# Patient Record
Sex: Male | Born: 1985 | Race: Black or African American | Hispanic: No | Marital: Single | Smoking: Current every day smoker
Health system: Southern US, Community
[De-identification: ages and names within clinical notes are randomized; demographics above are authoritative.]

## PROBLEM LIST (undated history)

## (undated) HISTORY — PX: NO PAST SURGERIES: SHX2092

---
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
## 2011-03-07 ENCOUNTER — Emergency Department: Payer: Self-pay | Admitting: Emergency Medicine

## 2011-03-12 ENCOUNTER — Emergency Department: Payer: Self-pay | Admitting: *Deleted

## 2011-03-14 ENCOUNTER — Emergency Department: Payer: Self-pay | Admitting: Emergency Medicine
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
## 2014-03-23 ENCOUNTER — Ambulatory Visit: Payer: Self-pay | Admitting: Family Medicine

## 2014-03-23 LAB — URINALYSIS, COMPLETE
Bacteria: NEGATIVE
Bilirubin,UR: NEGATIVE
Blood: NEGATIVE
Glucose,UR: NEGATIVE
Ketone: NEGATIVE
LEUKOCYTE ESTERASE: NEGATIVE
NITRITE: NEGATIVE
Ph: 5.5 (ref 5.0–8.0)
Protein: NEGATIVE
Specific Gravity: 1.015 (ref 1.000–1.030)
Squamous Epithelial: NONE SEEN

## 2015-09-11 ENCOUNTER — Ambulatory Visit
Admission: EM | Admit: 2015-09-11 | Discharge: 2015-09-11 | Disposition: A | Discharge status: Home / Self Care | Payer: Worker's Compensation | Attending: Family Medicine | Admitting: Family Medicine

## 2015-09-11 ENCOUNTER — Ambulatory Visit (INDEPENDENT_AMBULATORY_CARE_PROVIDER_SITE_OTHER): Payer: Worker's Compensation

## 2015-09-11 DIAGNOSIS — S40012A Contusion of left shoulder, initial encounter: Secondary | ICD-10-CM

## 2015-09-11 DIAGNOSIS — S20212A Contusion of left front wall of thorax, initial encounter: Secondary | ICD-10-CM | POA: Diagnosis not present

## 2015-09-11 MED ORDER — NAPROXEN 500 MG PO TABS: 500.0000 mg | ORAL_TABLET | Freq: Two times a day (BID) | ORAL | Status: DC

## 2015-09-11 MED ORDER — KETOROLAC TROMETHAMINE 60 MG/2ML IM SOLN
60.0000 mg | Freq: Once | INTRAMUSCULAR | Status: AC
  Administered 2015-09-11: 60 mg via INTRAMUSCULAR

## 2015-09-11 MED ORDER — TIZANIDINE HCL 4 MG PO TABS: 4.0000 mg | ORAL_TABLET | Freq: Four times a day (QID) | ORAL | Status: DC | PRN

## 2015-09-11 NOTE — ED Notes (Signed)
Patient states that he was at work and fell off a truck and fell on to the ground on his left shoulder. 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 Patient states that chest wall feels numb. Patient states that this happened today while at work this morning.

## 2015-09-11 NOTE — ED Provider Notes (Signed)
CSN: [id number]     Arrival date & time 09/11/15  1125 History   First MD Initiated Contact with Patient 09/11/15 1254     Chief Complaint  Patient presents with  . 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 He denies any loss of consciousness or feeling dazed did not strike his head, his main complaint is of shoulder pain which she feels mostly [facility] and over his anterior chest. He states that he has discomfort in his chest when he takes a deep breath cough or sneeze. He has no hemoptysis or shortness of breath and is having 100% O2 sat on room air. He states he is also having some numbness and tingling over his anterior chest. It is not radiating down his arm and he does not have any neck stiffness except with extreme lateral flexion to the opposite side.  History reviewed. No pertinent past medical history. Past Surgical History  Procedure Laterality Date  . No past surgeries     History reviewed. No pertinent family history. Social History  Substance Use Topics  . Smoking status: Current Every Day Smoker -- 0.50 packs/day    Types: Cigarettes  . Smokeless tobacco: None  . Alcohol Use: No    Review of Systems  Constitutional: Positive for activity change. Negative for fever, chills and fatigue.  Respiratory: Negative for cough, shortness of breath, wheezing and stridor.   Cardiovascular: Positive for chest pain.  Musculoskeletal: Positive for myalgias.  All other systems reviewed and are negative.   Allergies  Review of patient's allergies indicates no known allergies.  Home Medications   Prior to Admission  medications   Medication Sig Start Date End Date Taking? Authorizing Provider  naproxen (NAPROSYN) 500 MG tablet Take 1 tablet (500 mg total) by mouth 2 (two) times daily with a meal. 09/11/15   Lutricia FeilWilliam P Eris Hannan, PA-C  tiZANidine (ZANAFLEX) 4 MG tablet Take 1 tablet (4 mg total) by mouth every 6 (six) hours as needed for muscle spasms. 09/11/15   Lutricia FeilWilliam P Matha Masse, PA-C   Meds Ordered and Administered this Visit   Medications  ketorolac (TORADOL) injection 60 mg (60 mg Intramuscular Given 09/11/15 1329)    BP 135/76 mmHg  Pulse 113  Temp(Src) 98 F (36.7 C) (Tympanic)  Resp 16  Ht 6\' 1"  (1.854 m)  Wt 245 lb (111.131 kg)  BMI 32.33 kg/m2  SpO2 100% No data found.   Physical Exam  Constitutional: He is oriented to person, place, and time. He appears well-developed and well-nourished. No distress.  HENT:  Head: Normocephalic and atraumatic.  Right Ear: External ear normal.  Left Ear: External ear normal.  Nose: Nose normal.  Mouth/Throat: Oropharynx is clear and moist.  Eyes: Conjunctivae and EOM are normal. Pupils are equal, round, and reactive to light. Right eye exhibits no discharge. Left eye exhibits no discharge.  Neck: Normal range of motion. Neck supple.  Pulmonary/Chest: Effort normal and breath sounds normal. No stridor. No respiratory distress. He has no wheezes. He has no rales.  Musculoskeletal: Normal range of motion. He exhibits edema and tenderness.  Lymphadenopathy:    He  has no cervical adenopathy.  Neurological: He is alert and oriented to person, place, and time.  Skin: Skin is warm and dry. He is not diaphoretic.  Psychiatric: He has a normal mood and affect. His behavior is normal. Judgment and thought content normal.  Nursing note and vitals reviewed.   ED Course  Procedures (including critical care time)  Labs Review Labs Reviewed - No data to display  Imaging Review Dg Chest 2 View  09/11/2015  CLINICAL DATA:  Fall 4- 5 feet off truck today. Back  and right shoulder pain. EXAM: CHEST  2 VIEW COMPARISON:  None. FINDINGS: Lungs are well inflated without effusion, consolidation or pneumothorax. Cardiomediastinal silhouette is within normal. Bones and soft tissues are within normal. IMPRESSION: No acute findings. Electronically Signed   By: Elberta Fortis M.D.   On: 09/11/2015 14:01   Dg Shoulder Left  09/11/2015  CLINICAL DATA:  Acute left shoulder pain after falling off truck today. Initial encounter. EXAM: LEFT SHOULDER - 2+ VIEW COMPARISON:  None. FINDINGS: There is no evidence of fracture or dislocation. There is no evidence of arthropathy or other focal bone abnormality. Soft tissues are unremarkable. IMPRESSION: Normal left shoulder. Electronically Signed   By: Lupita Raider, M.D.   On: 09/11/2015 14:02     Visual Acuity Review  Right Eye Distance:   Left Eye Distance:   Bilateral Distance:    Right Eye Near:   Left Eye Near:    Bilateral Near:     Medications  ketorolac (TORADOL) injection 60 mg (60 mg Intramuscular Given 09/11/15 1329)     MDM   1. Contusion of left chest wall, initial encounter   2. Contusion of left shoulder, initial encounter    New Prescriptions   NAPROXEN (NAPROSYN) 500 MG TABLET    Take 1 tablet (500 mg total) by mouth 2 (two) times daily with a meal.   TIZANIDINE (ZANAFLEX) 4 MG TABLET    Take 1 tablet (4 mg total) by mouth every 6 (six) hours as needed for muscle spasms.  Plan: 1. Test/x-ray results and diagnosis reviewed with patient 2. rx as per orders; risks, benefits, potential side effects reviewed with patient 3. Recommend supportive treatment with Frequent cough and deep breathing. Use ice initially then may start heat after 48 hours. I have shown him and he has demonstrated pendulum exercises for his shoulder to prevent frozen shoulder. Follow-up is with the Kela Millin FNP at [facility] if he has any problems or issues. He require orthopedic consult or physical therapy. Given him a note  for out of work for 2 days and restrictions for 1 week. 4. F/u prn if symptoms worsen or don't improve     Lutricia Feil, PA-C 09/11/15 1439

## 2015-09-11 NOTE — Discharge Instructions (Signed)
Chest Contusion A chest contusion is a deep bruise on your chest area. Contusions are the result of an injury that caused bleeding under the skin. A chest contusion may involve bruising of the skin, muscles, or ribs. The contusion may turn blue, purple, or yellow. Minor injuries will give you a painless contusion, but more severe contusions may stay painful and swollen for a few weeks. CAUSES  A contusion is usually caused by a blow, trauma, or direct force to an area of the body. SYMPTOMS   Swelling and redness of the injured area.  Discoloration of the injured area.  Tenderness and soreness of the injured area.  Pain. DIAGNOSIS  The diagnosis can be made by taking a history and performing a physical exam. An X-ray, CT scan, or MRI may be needed to determine if there were any associated injuries, such as broken bones (fractures) or internal injuries. TREATMENT  Often, the best treatment for a chest contusion is resting, icing, and applying cold compresses to the injured area. Deep breathing exercises may be recommended to reduce the risk of pneumonia. Over-the-counter medicines may also be recommended for pain control. HOME CARE INSTRUCTIONS   Put ice on the injured area.  Put ice in a plastic bag.  Place a towel between your skin and the bag.  Leave the ice on for 15-20 minutes, 03-04 times a day.  Only take over-the-counter or prescription medicines as directed by your caregiver. Your caregiver may recommend avoiding anti-inflammatory medicines (aspirin, ibuprofen, and naproxen) for 48 hours because these medicines may increase bruising.  Rest the injured area.  Perform deep-breathing exercises as directed by your caregiver.  Stop smoking if you smoke.  Do not lift objects over 5 pounds (2.3 kg) for 3 days or longer if recommended by your caregiver. SEEK IMMEDIATE MEDICAL CARE IF:   You have increased bruising or swelling.  You have pain that is getting worse.  You have  difficulty breathing.  You have dizziness, weakness, or fainting.  You have blood in your urine or stool.  You cough up or vomit blood.  Your swelling or pain is not relieved with medicines. MAKE SURE YOU:   Understand these instructions.  Will watch your condition.  Will get help right away if you are not doing well or get worse.   This information is not intended to replace advice given to you by your health care provider. Make sure you discuss any questions you have with your health care provider.   Document Released: 11/05/2000 Document Revised: 11/05/2011 Document Reviewed: 08/04/2011 Elsevier Interactive Patient Education 2016 [employer]  

## 2015-10-03 ENCOUNTER — Ambulatory Visit
Admission: EM | Admit: 2015-10-03 | Discharge: 2015-10-03 | Disposition: A | Discharge status: Home / Self Care | Payer: Worker's Compensation | Attending: Family Medicine | Admitting: Family Medicine

## 2015-10-03 DIAGNOSIS — M25512 Pain in left shoulder: Secondary | ICD-10-CM | POA: Diagnosis not present

## 2015-10-03 NOTE — ED Triage Notes (Signed)
Patient was seen here a few weeks ago for a workers comp injury, he is back today because he is complaining of left neck stiffness and left shoulder pain.

## 2015-10-03 NOTE — ED Provider Notes (Signed)
[facility]    CSN: [id number] Arrival date & time: 10/03/15  1127  First Provider Contact:  None       History   Chief Complaint Chief Complaint  Patient presents with  . Neck Injury    Workers Comp Injury    HPI Douglas Munoz is a 30 y.o. male.   HPI:  Patient presents today regarding his Workmen's Comp. Injury. He states that his symptoms are about the same Related to his left shoulder/ neck/arm. He has had some difficulty getting an appointment with Hassell [facility]imom[facility].  He states that he needed some type of claim number however never received the claim number. He is not gotten much information from his employer either.  History reviewed. No pertinent past medical history.  There are no active problems to display for this patient.   Past Surgical History:  Procedure Laterality Date  . NO PAST SURGERIES         Home Medications    Prior to Admission medications   Medication Sig Start Date End Date Taking? Authorizing Provider  naproxen (NAPROSYN) 500 MG tablet Take 1 tablet (500 mg total) by mouth 2 (two) times daily with a meal. 09/11/15  Yes Lutricia FeilWilliam P Roemer, PA-C  tiZANidine (ZANAFLEX) 4 MG tablet Take 1 tablet (4 mg total) by mouth every 6 (six) hours as needed for muscle spasms. 09/11/15  Yes Lutricia FeilWilliam P Roemer, PA-C    Family History History reviewed. No pertinent family history.  Social History Social History  Substance Use Topics  . Smoking status: Current Every Day Smoker    Packs/day: 0.50    Types: Cigarettes  . Smokeless tobacco: Never Used  . Alcohol use No     Allergies   Review of patient's allergies indicates no known allergies.   Review of Systems Review of Systems:  Negative except mentioned above.   Physical Exam Triage Vital Signs ED Triage Vitals  Enc Vitals Group     BP 10/03/15 1150 128/76     Pulse Rate 10/03/15 1150 73     Resp 10/03/15 1150 18     Temp 10/03/15 1150 98 F (36.7 C)     Temp Source 10/03/15  1150 Oral     SpO2 10/03/15 1150 100 %     Weight 10/03/15 1150 230 lb (104.3 kg)     Height 10/03/15 1150 6\' 1"  (1.854 m)     Head Circumference --      Peak Flow --      Pain Score 10/03/15 1151 7     Pain Loc --      Pain Edu? --      Excl. in GC? --    No data found.   Updated Vital Signs BP 128/76 (BP Location: Right Arm)   Pulse 73   Temp 98 F (36.7 C) (Oral)   Resp 18   Ht 6\' 1"  (1.854 m)   Wt 230 lb (104.3 kg)   SpO2 100%   BMI 30.34 kg/m      Physical Exam:  deferred   UC Treatments / Results  Labs (all labs ordered are listed, but only abnormal results are displayed) Labs Reviewed - No data to display  EKG  EKG Interpretation None       Radiology No results found.  Procedures Procedures (including critical care time)  Medications Ordered in UC Medications - No data to display   Initial Impression / Assessment and Plan / UC Course  I have  reviewed the triage vital signs and the nursing notes.  Pertinent labs & imaging results that were available during my care of the patient were reviewed by me and considered in my medical decision making (see chart for details).  Clinical Course   A/P:  Workmen's Comp. Injury-  Nurse called occupational health and they stated that a number is not required to get an appointment, the person he needs to speak with is at lunch so the patient will call back later today.  Patient may require further imaging and referral. Work restrictions given for one week for now.   Final Clinical Impressions(s) / UC Diagnoses   Final diagnoses:  None    New Prescriptions New Prescriptions   No medications on file     Jolene Provost, MD 10/03/15 1223

## 2016-06-18 ENCOUNTER — Telehealth (INDEPENDENT_AMBULATORY_CARE_PROVIDER_SITE_OTHER): Payer: Self-pay | Admitting: Orthopedic Surgery

## 2016-06-18 ENCOUNTER — Ambulatory Visit (INDEPENDENT_AMBULATORY_CARE_PROVIDER_SITE_OTHER): Payer: Self-pay | Admitting: Orthopedic Surgery

## 2016-06-18 NOTE — Telephone Encounter (Signed)
r/s appt with case mgr Carolee Rota while she was in the office today

## 2016-06-18 NOTE — Telephone Encounter (Signed)
Can you please call pt to resch his IME whenever you get the chance.  Thanks!

## 2016-07-16 ENCOUNTER — Ambulatory Visit (INDEPENDENT_AMBULATORY_CARE_PROVIDER_SITE_OTHER): Payer: Worker's Compensation | Admitting: Orthopedic Surgery

## 2016-07-16 ENCOUNTER — Encounter (INDEPENDENT_AMBULATORY_CARE_PROVIDER_SITE_OTHER): Payer: Self-pay | Admitting: Orthopedic Surgery

## 2016-07-16 DIAGNOSIS — M25512 Pain in left shoulder: Secondary | ICD-10-CM

## 2016-07-16 DIAGNOSIS — G8929 Other chronic pain: Secondary | ICD-10-CM
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
## 2016-08-14 ENCOUNTER — Ambulatory Visit (INDEPENDENT_AMBULATORY_CARE_PROVIDER_SITE_OTHER): Payer: Self-pay | Admitting: Orthopedic Surgery

## 2016-08-18 ENCOUNTER — Telehealth (INDEPENDENT_AMBULATORY_CARE_PROVIDER_SITE_OTHER): Payer: Self-pay

## 2016-08-18 NOTE — Telephone Encounter (Signed)
Faxed the 07/16/16 IME report to case mgr per her request

## 2018-05-02 ENCOUNTER — Emergency Department
Admission: EM | Admit: 2018-05-02 | Discharge: 2018-05-02 | Disposition: A | Discharge status: Other Acute Inpatient Hospital | Payer: Self-pay | Attending: Emergency Medicine | Admitting: Emergency Medicine

## 2018-05-02 ENCOUNTER — Other Ambulatory Visit: Payer: Self-pay

## 2018-05-02 DIAGNOSIS — Y9389 Activity, other specified: Secondary | ICD-10-CM | POA: Insufficient documentation

## 2018-05-02 DIAGNOSIS — F17210 Nicotine dependence, cigarettes, uncomplicated: Secondary | ICD-10-CM | POA: Insufficient documentation

## 2018-05-02 DIAGNOSIS — Y929 Unspecified place or not applicable: Secondary | ICD-10-CM | POA: Insufficient documentation

## 2018-05-02 DIAGNOSIS — Y998 Other external cause status: Secondary | ICD-10-CM | POA: Insufficient documentation

## 2018-05-02 DIAGNOSIS — T300 Burn of unspecified body region, unspecified degree: Secondary | ICD-10-CM

## 2018-05-02 DIAGNOSIS — T23311A Burn of third degree of right thumb (nail), initial encounter: Secondary | ICD-10-CM | POA: Insufficient documentation

## 2018-05-02 DIAGNOSIS — X102XXA Contact with fats and cooking oils, initial encounter: Secondary | ICD-10-CM | POA: Insufficient documentation

## 2018-05-02 DIAGNOSIS — Z79899 Other long term (current) drug therapy: Secondary | ICD-10-CM | POA: Insufficient documentation

## 2018-05-02 DIAGNOSIS — T23231A Burn of second degree of multiple right fingers (nail), not including thumb, initial encounter: Secondary | ICD-10-CM | POA: Insufficient documentation

## 2018-05-02 MED ORDER — HYDROMORPHONE HCL 1 MG/ML IJ SOLN
1.0000 mg | Freq: Once | INTRAMUSCULAR | Status: AC
  Administered 2018-05-02: 1 mg via INTRAVENOUS
  Filled 2018-05-02: qty 1

## 2018-05-02 MED ORDER — OXYCODONE-ACETAMINOPHEN 5-325 MG PO TABS
2.0000 | ORAL_TABLET | Freq: Once | ORAL | Status: AC
  Administered 2018-05-02: 2 via ORAL
  Filled 2018-05-02: qty 2

## 2018-05-02 MED ORDER — KETOROLAC TROMETHAMINE 30 MG/ML IJ SOLN
30.0000 mg | Freq: Once | INTRAMUSCULAR | Status: AC
  Administered 2018-05-02: 30 mg via INTRAVENOUS
  Filled 2018-05-02: qty 1

## 2018-05-02 MED ORDER — ONDANSETRON HCL 4 MG/2ML IJ SOLN
4.0000 mg | Freq: Once | INTRAMUSCULAR | Status: AC
  Administered 2018-05-02: 4 mg via INTRAVENOUS
  Filled 2018-05-02: qty 2

## 2018-05-02 MED ORDER — SODIUM CHLORIDE 0.9 % IV BOLUS
1000.0000 mL | Freq: Once | INTRAVENOUS | Status: AC
  Administered 2018-05-02: 1000 mL via INTRAVENOUS
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
## 2018-10-22 ENCOUNTER — Encounter (INDEPENDENT_AMBULATORY_CARE_PROVIDER_SITE_OTHER): Payer: Self-pay | Admitting: Orthopedic Surgery

## 2019-03-07 ENCOUNTER — Emergency Department
Admission: EM | Admit: 2019-03-07 | Discharge: 2019-03-07 | Disposition: A | Discharge status: Home / Self Care | Payer: Managed Care, Other (non HMO) | Attending: Emergency Medicine | Admitting: Emergency Medicine

## 2019-03-07 ENCOUNTER — Other Ambulatory Visit: Payer: Self-pay

## 2019-03-07 DIAGNOSIS — Y999 Unspecified external cause status: Secondary | ICD-10-CM | POA: Diagnosis not present

## 2019-03-07 DIAGNOSIS — Y929 Unspecified place or not applicable: Secondary | ICD-10-CM | POA: Insufficient documentation

## 2019-03-07 DIAGNOSIS — F17210 Nicotine dependence, cigarettes, uncomplicated: Secondary | ICD-10-CM | POA: Insufficient documentation

## 2019-03-07 DIAGNOSIS — S46001A Unspecified injury of muscle(s) and tendon(s) of the rotator cuff of right shoulder, initial encounter: Secondary | ICD-10-CM | POA: Insufficient documentation

## 2019-03-07 DIAGNOSIS — Y9389 Activity, other specified: Secondary | ICD-10-CM | POA: Diagnosis not present

## 2019-03-07 DIAGNOSIS — X509XXA Other and unspecified overexertion or strenuous movements or postures, initial encounter: Secondary | ICD-10-CM | POA: Insufficient documentation

## 2019-03-07 DIAGNOSIS — S4991XA Unspecified injury of right shoulder and upper arm, initial encounter: Secondary | ICD-10-CM | POA: Diagnosis present

## 2019-03-07 MED ORDER — MELOXICAM 15 MG PO TABS: 15.0000 mg | ORAL_TABLET | Freq: Every day | ORAL | 0 refills | Status: DC

## 2019-03-07 MED ORDER — TRAMADOL HCL 50 MG PO TABS: 50.0000 mg | ORAL_TABLET | Freq: Four times a day (QID) | ORAL | 0 refills | Status: DC | PRN

## 2019-03-07 NOTE — ED Notes (Signed)
See triage note  Presents with right shoulder pain  State [place] started couple of days ago  Thinks may have pulled something  Denies any fall

## 2019-03-07 NOTE — ED Provider Notes (Signed)
[facility] Emergency Department Provider Note ____________________________________________  Time seen: Approximately 3:04 PM  I have reviewed the triage vital signs and the nursing notes.   HISTORY  Chief Complaint Shoulder Pain    HPI Douglas Munoz is a 34 y.o. male who presents to the emergency department for evaluation and treatment of right shoulder pain. 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  Pain is more under the armpit and around the front of the shoulder.  No relief with over-the-counter medications.  History reviewed. No pertinent past medical history.  There are no problems to display for this patient.   Past Surgical History:  Procedure Laterality Date  . NO PAST SURGERIES      Prior to Admission medications   Medication Sig Start Date End Date Taking? Authorizing Provider  meloxicam (MOBIC) 15 MG tablet Take 1 tablet (15 mg total) by mouth daily. 03/07/19   Lakyia Behe, Johnette Abraham B, FNP  traMADol (ULTRAM) 50 MG tablet Take 1 tablet (50 mg total) by mouth every 6 (six) hours as needed. 03/07/19   Victorino Dike, FNP    Allergies Patient has no known allergies.  No family history on file.  Social History Social History   Tobacco Use  . Smoking status: Current Every Day Smoker    Packs/day: 0.50    Types: Cigarettes  . Smokeless tobacco: Never Used  Substance Use Topics  . Alcohol use: No    Alcohol/week: 0.0 standard drinks  . Drug use: No    Review of Systems Constitutional: Negative for fever. Cardiovascular: Negative for chest pain. Respiratory: Negative for shortness of breath. Musculoskeletal: Positive for right shoulder pain. Skin: Negative for open wounds or lesions. Neurological: Negative for decrease in sensation  ____________________________________________   PHYSICAL EXAM:  VITAL SIGNS: ED Triage Vitals  Enc Vitals  Group     BP 03/07/19 1124 (!) 151/71     Pulse Rate 03/07/19 1124 (!) 50     Resp 03/07/19 1124 17     Temp 03/07/19 1124 98.2 F (36.8 C)     Temp Source 03/07/19 1124 Oral     SpO2 03/07/19 1124 98 %     Weight 03/07/19 1125 260 lb (117.9 kg)     Height 03/07/19 1125 6\' 1"  (1.854 m)     Head Circumference --      Peak Flow --      Pain Score 03/07/19 1125 10     Pain Loc --      Pain Edu? --      Excl. in [place]? --     Constitutional: Alert and oriented. Well appearing and in no acute distress. Eyes: Conjunctivae are clear without discharge or drainage Head: Atraumatic Neck: Supple. Respiratory: No cough. Respirations are even and unlabored. Musculoskeletal: Right shoulder without step-off or deformity.  Painful arc on exam. Neurologic: Motor and sensory function is intact. Skin: No open wounds or lesions on exposed skin surfaces Psychiatric: Affect and behavior are appropriate.  ____________________________________________   LABS (all labs ordered are listed, but only abnormal results are displayed)  Labs Reviewed - No data to display ____________________________________________  RADIOLOGY  Not indicated ____________________________________________   PROCEDURES  Procedures  ____________________________________________   INITIAL IMPRESSION / ASSESSMENT AND PLAN / ED COURSE  Douglas Munoz is a 34 y.o. who presents to the emergency department for treatment and evaluation of right shoulder pain that started while working  on a car. Symptoms and exam most consistent with rotator cuff strain. He will be treated with meloxicam and tramadol and encouraged to avoid strenuous activity for the next week.  Patient instructed to follow-up with orthopedics if not improving over the work.  He was also instructed to return to the emergency department for symptoms that change or worsen if unable schedule an appointment with orthopedics or primary care.  Medications - No data  to display  Pertinent labs & imaging results that were available during my care of the patient were reviewed by me and considered in my medical decision making (see chart for details).  _________________________________________   FINAL CLINICAL IMPRESSION(S) / ED DIAGNOSES  Final diagnoses:  Rotator cuff injury, right, initial encounter    ED Discharge Orders         Ordered    meloxicam (MOBIC) 15 MG tablet  Daily     03/07/19 1147    traMADol (ULTRAM) 50 MG tablet  Every 6 hours PRN     03/07/19 1147           If controlled substance prescribed during this visit, 12 month history viewed on the [facility] prior to issuing an initial prescription for Schedule II or III opiod.   Chinita Pester, FNP 03/07/19 1539    Phineas Semen, MD 03/07/19 1600

## 2019-03-07 NOTE — ED Triage Notes (Signed)
Pt c/o right pain for the past couple of days, states "I pulled something"

## 2019-08-04 ENCOUNTER — Other Ambulatory Visit: Payer: Self-pay

## 2019-08-04 ENCOUNTER — Emergency Department
Admission: EM | Admit: 2019-08-04 | Discharge: 2019-08-04 | Disposition: A | Discharge status: Home / Self Care | Payer: Managed Care, Other (non HMO) | Attending: Emergency Medicine | Admitting: Emergency Medicine

## 2019-08-04 DIAGNOSIS — F17210 Nicotine dependence, cigarettes, uncomplicated: Secondary | ICD-10-CM | POA: Diagnosis not present

## 2019-08-04 DIAGNOSIS — K0889 Other specified disorders of teeth and supporting structures: Secondary | ICD-10-CM

## 2019-08-04 DIAGNOSIS — K029 Dental caries, unspecified: Secondary | ICD-10-CM | POA: Insufficient documentation

## 2019-08-04 MED ORDER — LIDOCAINE VISCOUS HCL 2 % MT SOLN: 5.0000 mL | Freq: Four times a day (QID) | OROMUCOSAL | 0 refills | Status: DC | PRN

## 2019-08-04 MED ORDER — AMOXICILLIN 500 MG PO CAPS: 500.0000 mg | ORAL_CAPSULE | Freq: Three times a day (TID) | ORAL | 0 refills | Status: DC

## 2019-08-04 MED ORDER — TRAMADOL HCL 50 MG PO TABS: 50.0000 mg | ORAL_TABLET | Freq: Four times a day (QID) | ORAL | 0 refills | Status: AC | PRN

## 2019-08-04 MED ORDER — IBUPROFEN 600 MG PO TABS: 600.0000 mg | ORAL_TABLET | Freq: Three times a day (TID) | ORAL | 0 refills | Status: DC | PRN

## 2019-08-04 MED ORDER — LIDOCAINE VISCOUS HCL 2 % MT SOLN
15.0000 mL | Freq: Once | OROMUCOSAL | Status: AC
  Administered 2019-08-04: 15 mL via OROMUCOSAL
  Filled 2019-08-04: qty 15

## 2019-08-04 NOTE — ED Notes (Signed)
See triage note  Presents with possible dental abscess having increased  Pain over the past 2-3 days

## 2019-08-04 NOTE — ED Triage Notes (Signed)
Pt comes via POV from home with c/o dental pain. Pt states this started 3 days ago. Pt states right lower side

## 2019-08-04 NOTE — Discharge Instructions (Addendum)
Take medication as directed and try to follow-up from list of dental clinics provided in your discharge care instructions.  OPTIONS FOR DENTAL FOLLOW UP CARE  [place] [facility] and [facility] Services - Local Safety Net Dental Clinics [email or web address]   [facility] [phone])  [facility] [phone])  [place] [phone] ext [phone])  [facility] [phone])  [facility] [phone]) This clinic caters to the indigent population and is on a lottery system. Location: [employer] [facility], [employer], [address], [place] Clinic Hours: Wednesdays from 6pm - 9pm, patients seen by a lottery system. For dates, call or go to [email or web address] Services: Cleanings, fillings and simple extractions. Payment Options: DENTAL WORK IS FREE OF CHARGE. Bring proof of income or support. Best way to get seen: Arrive at 5:15 pm - this is a lottery, NOT first come/first serve, so arriving earlier will not increase your chances of being seen.     [facility] [phone] Select option 1 for emergencies   Location: [facility], [place], [address], [place] Clinic Hours: No walk-ins accepted - call the day before to schedule an appointment. Check in times are 9:30 am and 1:30 pm. Services: Simple extractions, temporary fillings, pulpectomy/pulp debridement, uncomplicated abscess drainage. Payment Options: PAYMENT IS DUE AT THE TIME OF SERVICE.  Fee is usually $100-200, additional surgical procedures (e.g. abscess drainage) may be extra. Cash, checks, Visa/MasterCard accepted.  Can file Medicaid if patient is covered for dental - patient should call case worker to check. No discount for [facility] patients. Best way to get seen: MUST call the day before and get onto the  schedule. Can usually be seen the next 1-2 days. No walk-ins accepted.     [facility] [phone]   Location: [facility], [address] [place] Clinic Hours: M, W, Th, F 8am or 1:30pm, Tues 9a or 1:30 - first come/first served. Services: Simple extractions, temporary fillings, uncomplicated abscess drainage.  You do not need to be an [facility] resident. Payment Options: PAYMENT IS DUE AT THE TIME OF SERVICE. Dental insurance, otherwise sliding scale - bring proof of income or support. Depending on income and treatment needed, cost is usually $50-200. Best way to get seen: Arrive early as it is first come/first served.     [facility] [phone]   Location: [address] Clinic Hours: Mon-Thu 8a-5p Services: Most basic dental services including extractions and fillings. Payment Options: PAYMENT IS DUE AT THE TIME OF SERVICE. Sliding scale, up to 50% off - bring proof if income or support. Medicaid with dental option accepted. Best way to get seen: Call to schedule an appointment, can usually be seen within 2 weeks OR they will try to see walk-ins - show up at 8a or 2p (you may have to wait).     [facility] [phone] [place] RESIDENTS ONLY   Location: [facility], [address] [place], [place] [address] Clinic Hours: By appointment only. Monday - Thursday 8am-5pm, Friday 8am-12pm Services: Cleanings, fillings, extractions. Payment Options: PAYMENT IS DUE AT THE TIME OF SERVICE. Cash, Visa or MasterCard. Sliding scale - $30 minimum per service. Best way to get seen: Come in to office, complete packet and make an appointment - need proof of income or support monies for each household member and proof of [facility] residence. Usually takes about a  month to get in.     [facility] [phone]    Location: [address] [place] Clinic Hours: Walk-in Urgent Care Dental Services are offered Monday-Friday mornings only. The numbers of emergencies accepted daily is limited to the number of providers available. Maximum 15 - Mondays, Wednesdays & Thursdays Maximum 10 - Tuesdays & Fridays Services: You do not need to be a [facility] resident to be seen for a dental emergency. Emergencies are defined as pain, swelling, abnormal bleeding, or dental trauma. Walkins will receive x-rays if needed. NOTE: Dental cleaning is not an emergency. Payment Options: PAYMENT IS DUE AT THE TIME OF SERVICE. Minimum co-pay is $40.00 for uninsured patients. Minimum co-pay is $3.00 for Medicaid with dental coverage. Dental Insurance is accepted and must be presented at time of visit. Medicare does not cover dental. Forms of payment: Cash, credit card, checks. Best way to get seen: If not previously registered with the clinic, walk-in dental registration begins at 7:15 am and is on a first come/first serve basis. If previously registered with the clinic, call to make an appointment.     [facility] [place] ONLY   Location: [address] [place], [place] Clinic Hours: Mon-Thu 10a-2p Services: Extractions only! Payment Options: FREE (donations accepted) - bring proof of income or support Best way to get seen: Call and schedule an appointment OR come at 8am on the 1st Monday of every month (except for holidays) when it is first come/first served.     [facility] [phone]   Location: [place], [place] Clinic Hours: Friday mornings Services, Payment Options, Best way to get seen: Call for info

## 2019-08-04 NOTE — ED Provider Notes (Signed)
[facility] Emergency Department Provider Note   ____________________________________________   First MD Initiated Contact with Patient 08/04/19 1143     (approximate)  I have reviewed the triage vital signs and the nursing notes.   HISTORY  Chief Complaint Dental Pain    HPI Douglas Munoz is a 34 y.o. male patient complains of 3 days of dental pain.  Patient the pain is located on the right lower molar area.  Patient rates pain as a 10/10.  Patient described pain as "achy".  No palliative measures for complaint.         History reviewed. No pertinent past medical history.  There are no problems to display for this patient.   Past Surgical History:  Procedure Laterality Date  . NO PAST SURGERIES      Prior to Admission medications   Medication Sig Start Date End Date Taking? Authorizing Provider  amoxicillin (AMOXIL) 500 MG capsule Take 1 capsule (500 mg total) by mouth 3 (three) times daily. 08/04/19   Joni Reining, PA-C  ibuprofen (ADVIL) 600 MG tablet Take 1 tablet (600 mg total) by mouth every 8 (eight) hours as needed. 08/04/19   Joni Reining, PA-C  lidocaine (XYLOCAINE) 2 % solution Use as directed 5 mLs in the mouth or throat every 6 (six) hours as needed for mouth pain. Oral swish 08/04/19   Joni Reining, PA-C  traMADol (ULTRAM) 50 MG tablet Take 1 tablet (50 mg total) by mouth every 6 (six) hours as needed. 08/04/19 08/03/20  Joni Reining, PA-C    Allergies Patient has no known allergies.  No family history on file.  Social History Social History   Tobacco Use  . Smoking status: Current Every Day Smoker    Packs/day: 0.50    Types: Cigarettes  . Smokeless tobacco: Never Used  Substance Use Topics  . Alcohol use: No    Alcohol/week: 0.0 standard drinks  . Drug use: No    Review of Systems Constitutional: No fever/chills Eyes: No visual changes. ENT: No sore throat.  Dental pain. Cardiovascular: Denies chest  pain. Respiratory: Denies shortness of breath. Gastrointestinal: No abdominal pain.  No nausea, no vomiting.  No diarrhea.  No constipation. Genitourinary: Negative for dysuria. Musculoskeletal: Negative for back pain. Skin: Negative for rash. Neurological: Negative for headaches, focal weakness or numbness.   ____________________________________________   PHYSICAL EXAM:  VITAL SIGNS: ED Triage Vitals  Enc Vitals Group     BP 08/04/19 1142 139/89     Pulse Rate 08/04/19 1142 (!) 52     Resp 08/04/19 1142 17     Temp 08/04/19 1142 97.7 F (36.5 C)     Temp src --      SpO2 08/04/19 1142 99 %     Weight 08/04/19 1141 240 lb (108.9 kg)     Height 08/04/19 1141 6\' 2"  (1.88 m)     Head Circumference --      Peak Flow --      Pain Score 08/04/19 1141 10     Pain Loc --      Pain Edu? --      Excl. in [place]? --    Constitutional: Alert and oriented. Well appearing and in no acute distress. Mouth/Throat: Mucous membranes are moist.  Oropharynx non-erythematous.  Dental caries with mild gingival edema tooth #28. Hematological/Lymphatic/Immunilogical: No cervical lymphadenopathy. Cardiovascular: Bradycardic, regular rhythm. Grossly normal heart sounds.  Good peripheral circulation. Respiratory: Normal respiratory effort.  No retractions.  Lungs CTAB. Neurologic:  Normal speech and language. No gross focal neurologic deficits are appreciated. No gait instability. Skin:  Skin is warm, dry and intact. No rash noted. Psychiatric: Mood and affect are normal. Speech and behavior are normal.  ____________________________________________   LABS (all labs ordered are listed, but only abnormal results are displayed)  Labs Reviewed - No data to display ____________________________________________  EKG   ____________________________________________  RADIOLOGY  ED MD interpretation:    Official radiology report(s): No results  found.  ____________________________________________   PROCEDURES  Procedure(s) performed (including Critical Care):  Procedures   ____________________________________________   INITIAL IMPRESSION / ASSESSMENT AND PLAN / ED COURSE  As part of my medical decision making, I reviewed the following data within the [place]   Patient presents with dental pain secondary to dental caries.  Patient given discharge care instruction list of dental clinics for follow-up care.       Douglas Munoz was evaluated in Emergency Department on 08/04/2019 for the symptoms described in the history of present illness. He was evaluated in the context of the global COVID-19 pandemic, which necessitated consideration that the patient might be at risk for infection with the SARS-CoV-2 virus that causes COVID-19. Institutional protocols and algorithms that pertain to the evaluation of patients at risk for COVID-19 are in a state of rapid change based on information released by regulatory bodies including the CDC and federal and state organizations. These policies and algorithms were followed during the patient's care in the ED.       ____________________________________________   FINAL CLINICAL IMPRESSION(S) / ED DIAGNOSES  Final diagnoses:  Pain, dental     ED Discharge Orders         Ordered    traMADol (ULTRAM) 50 MG tablet  Every 6 hours PRN     Discontinue  Reprint     08/04/19 1212    amoxicillin (AMOXIL) 500 MG capsule  3 times daily     Discontinue  Reprint     08/04/19 1212    ibuprofen (ADVIL) 600 MG tablet  Every 8 hours PRN     Discontinue  Reprint     08/04/19 1212    lidocaine (XYLOCAINE) 2 % solution  Every 6 hours PRN     Discontinue  Reprint     08/04/19 1212           Note:  This document was prepared using Dragon voice recognition software and may include unintentional dictation errors.    Sable Feil, PA-C 08/04/19 1219    Nena Polio, MD 08/04/19 [phone]

## 2020-03-11 ENCOUNTER — Emergency Department: Payer: Managed Care, Other (non HMO)

## 2020-03-11 ENCOUNTER — Other Ambulatory Visit: Payer: Self-pay

## 2020-03-11 ENCOUNTER — Emergency Department
Admission: EM | Admit: 2020-03-11 | Discharge: 2020-03-11 | Disposition: A | Discharge status: Home / Self Care | Payer: Managed Care, Other (non HMO) | Attending: Emergency Medicine | Admitting: Emergency Medicine

## 2020-03-11 ENCOUNTER — Encounter: Payer: Self-pay | Admitting: Emergency Medicine

## 2020-03-11 DIAGNOSIS — U071 COVID-19: Secondary | ICD-10-CM | POA: Diagnosis not present

## 2020-03-11 DIAGNOSIS — R0602 Shortness of breath: Secondary | ICD-10-CM | POA: Diagnosis present

## 2020-03-11 DIAGNOSIS — F17210 Nicotine dependence, cigarettes, uncomplicated: Secondary | ICD-10-CM | POA: Insufficient documentation

## 2020-03-11 LAB — CBC WITH DIFFERENTIAL/PLATELET
Abs Immature Granulocytes: 0.1 10*3/uL — ABNORMAL HIGH (ref 0.00–0.07)
Basophils Absolute: 0 10*3/uL (ref 0.0–0.1)
Basophils Relative: 0 %
Eosinophils Absolute: 0.1 10*3/uL (ref 0.0–0.5)
Eosinophils Relative: 1 %
HCT: 41 % (ref 39.0–52.0)
Hemoglobin: 14.1 g/dL (ref 13.0–17.0)
Immature Granulocytes: 1 %
Lymphocytes Relative: 8 %
Lymphs Abs: 1 10*3/uL (ref 0.7–4.0)
MCH: 27.7 pg (ref 26.0–34.0)
MCHC: 34.4 g/dL (ref 30.0–36.0)
MCV: 80.6 fL (ref 80.0–100.0)
Monocytes Absolute: 0.5 10*3/uL (ref 0.1–1.0)
Monocytes Relative: 4 %
Neutro Abs: 10.5 10*3/uL — ABNORMAL HIGH (ref 1.7–7.7)
Neutrophils Relative %: 86 %
Platelets: 147 10*3/uL — ABNORMAL LOW (ref 150–400)
RBC: 5.09 MIL/uL (ref 4.22–5.81)
RDW: 13.9 % (ref 11.5–15.5)
WBC: 12.2 10*3/uL — ABNORMAL HIGH (ref 4.0–10.5)
nRBC: 0 % (ref 0.0–0.2)

## 2020-03-11 LAB — COMPREHENSIVE METABOLIC PANEL
ALT: 76 U/L — ABNORMAL HIGH (ref 0–44)
AST: 55 U/L — ABNORMAL HIGH (ref 15–41)
Albumin: 4.1 g/dL (ref 3.5–5.0)
Alkaline Phosphatase: 50 U/L (ref 38–126)
Anion gap: 9 (ref 5–15)
BUN: 11 mg/dL (ref 6–20)
CO2: 25 mmol/L (ref 22–32)
Calcium: 9.1 mg/dL (ref 8.9–10.3)
Chloride: 104 mmol/L (ref 98–111)
Creatinine, Ser: 1.18 mg/dL (ref 0.61–1.24)
GFR, Estimated: >60 mL/min (ref >60)
Glucose, Bld: 103 mg/dL — ABNORMAL HIGH (ref 70–99)
Potassium: 3.6 mmol/L (ref 3.5–5.1)
Sodium: 138 mmol/L (ref 135–145)
Total Bilirubin: 1.1 mg/dL (ref 0.3–1.2)
Total Protein: 7.6 g/dL (ref 6.5–8.1)

## 2020-03-11 LAB — TROPONIN I (HIGH SENSITIVITY): Troponin I (High Sensitivity): 4 ng/L (ref <18)

## 2020-03-11 LAB — FIBRIN DERIVATIVES D-DIMER (ARMC ONLY): Fibrin derivatives D-dimer (ARMC): 290.44 ng/mL (FEU) (ref 0.00–499.00)

## 2020-03-11 MED ORDER — AMOXICILLIN 875 MG PO TABS: 875.0000 mg | ORAL_TABLET | Freq: Two times a day (BID) | ORAL | 0 refills | Status: DC

## 2020-03-11 MED ORDER — AZITHROMYCIN 250 MG PO TABS: ORAL_TABLET | ORAL | 0 refills | Status: AC

## 2020-03-11 MED ORDER — ALBUTEROL SULFATE HFA 108 (90 BASE) MCG/ACT IN AERS: 2.0000 | INHALATION_SPRAY | Freq: Four times a day (QID) | RESPIRATORY_TRACT | 2 refills | Status: AC | PRN

## 2020-03-11 MED ORDER — ALBUTEROL SULFATE HFA 108 (90 BASE) MCG/ACT IN AERS
2.0000 | INHALATION_SPRAY | Freq: Once | RESPIRATORY_TRACT | Status: AC
Start: 2020-03-11 — End: 2020-03-11
  Administered 2020-03-11: 2 via RESPIRATORY_TRACT
  Filled 2020-03-11: qty 6.7

## 2020-03-11 NOTE — ED Triage Notes (Signed)
Pt reports dx'd with covid on Tuesday and today started to feel like it was hard to breathe. Pt reports pain in his chest when he takes a deep breath or coughs and states that it is tight feeling

## 2020-03-11 NOTE — ED Notes (Signed)
Pt walked to lobby.

## 2020-03-11 NOTE — ED Triage Notes (Signed)
Pt in via EMS from home with c/o needing a check up  Pt dx'd with covid on 1/11, started feeling better but wants to be evaluated. 122/93, 100% RA, HR 96

## 2020-03-11 NOTE — Discharge Instructions (Addendum)
Take azithromycin and amoxicillin as directed. Take 2 puffs of albuterol every 4 hours as needed for shortness of breath.

## 2020-03-11 NOTE — ED Provider Notes (Signed)
[facility]-EMERGENCY DEPARTMENT  ____________________________________________  Time seen: Approximately 5:24 PM  I have reviewed the triage vital signs and the nursing notes.   HISTORY  Chief Complaint Shortness of Breath   Historian Patient     HPI Douglas Munoz is a 35 y.o. male presents to the emergency department after being symptomatic with COVID-19 for 7 days.  Patient states that he has been controlling fever at home with antipyretics and has been staying hydrated.  Patient states that he was feeling better overall but states that he woke up today with new onset shortness of breath and breathlessness.  He states that he has had some chest tightness at home.  No history of asthma.  No nausea, vomiting or abdominal pain.  No recent travel.  No prior history of DVT or PE.   History reviewed. No pertinent past medical history.   Immunizations up to date:  Yes.     History reviewed. No pertinent past medical history.  There are no problems to display for this patient.   Past Surgical History:  Procedure Laterality Date  . NO PAST SURGERIES      Prior to Admission medications   Medication Sig Start Date End Date Taking? Authorizing Provider  albuterol (VENTOLIN HFA) 108 (90 Base) MCG/ACT inhaler Inhale 2 puffs into the lungs every 6 (six) hours as needed for wheezing or shortness of breath. 03/11/20  Yes Pia Mau M, PA-C  amoxicillin (AMOXIL) 875 MG tablet Take 1 tablet (875 mg total) by mouth 2 (two) times daily. 03/11/20  Yes Pia Mau M, PA-C  azithromycin (ZITHROMAX Z-PAK) 250 MG tablet Take 2 tablets (500 mg) on  Day 1,  followed by 1 tablet (250 mg) once daily on Days 2 through 5. 03/11/20 03/16/20 Yes Pia Mau M, PA-C  ibuprofen (ADVIL) 600 MG tablet Take 1 tablet (600 mg total) by mouth every 8 (eight) hours as needed. 08/04/19   Joni Reining, PA-C  lidocaine (XYLOCAINE) 2 % solution Use as directed 5 mLs in the mouth or throat every 6 (six) hours as  needed for mouth pain. Oral swish 08/04/19   Joni Reining, PA-C  traMADol (ULTRAM) 50 MG tablet Take 1 tablet (50 mg total) by mouth every 6 (six) hours as needed. 08/04/19 08/03/20  Joni Reining, PA-C    Allergies Patient has no known allergies.  No family history on file.  Social History Social History   Tobacco Use  . Smoking status: Current Every Day Smoker    Packs/day: 0.50    Types: Cigarettes  . Smokeless tobacco: Never Used  Substance Use Topics  . Alcohol use: No    Alcohol/week: 0.0 standard drinks  . Drug use: No     Review of Systems  Constitutional: No fever/chills Eyes:  No discharge ENT: No upper respiratory complaints. Respiratory: Patient has shortness of breath.  Gastrointestinal:   No nausea, no vomiting.  No diarrhea.  No constipation. Musculoskeletal: Negative for musculoskeletal pain. Skin: Negative for rash, abrasions, lacerations, ecchymosis.   ____________________________________________   PHYSICAL EXAM:  VITAL SIGNS: ED Triage Vitals [03/11/20 1614]  Enc Vitals Group     BP 122/72     Pulse Rate 97     Resp 20     Temp 98.1 F (36.7 C)     Temp Source Oral     SpO2 99 %     Weight 260 lb (117.9 kg)     Height 6\' 2"  (1.88 m)     Head  Circumference      Peak Flow      Pain Score 6     Pain Loc      Pain Edu?      Excl. in [place]?      Constitutional: Alert and oriented. Well appearing and in no acute distress. Eyes: Conjunctivae are normal. PERRL. EOMI. Head: Atraumatic. ENT:      Nose: No congestion/rhinnorhea.      Mouth/Throat: Mucous membranes are moist.  Neck: No stridor.  No cervical spine tenderness to palpation. Cardiovascular: Normal rate, regular rhythm. Normal S1 and S2.  Good peripheral circulation. Respiratory: Normal respiratory effort without tachypnea or retractions. Lungs CTAB. Good air entry to the bases with no decreased or absent breath sounds Gastrointestinal: Bowel sounds x 4 quadrants. Soft and  nontender to palpation. No guarding or rigidity. No distention. Musculoskeletal: Full range of motion to all extremities. No obvious deformities noted Neurologic:  Normal for age. No gross focal neurologic deficits are appreciated.  Skin:  Skin is warm, dry and intact. No rash noted. Psychiatric: Mood and affect are normal for age. Speech and behavior are normal.   ____________________________________________   LABS (all labs ordered are listed, but only abnormal results are displayed)  Labs Reviewed  CBC WITH DIFFERENTIAL/PLATELET - Abnormal; Notable for the following components:      Result Value   WBC 12.2 (*)    Platelets 147 (*)    Neutro Abs 10.5 (*)    Abs Immature Granulocytes 0.10 (*)    All other components within normal limits  COMPREHENSIVE METABOLIC PANEL - Abnormal; Notable for the following components:   Glucose, Bld 103 (*)    AST 55 (*)    ALT 76 (*)    All other components within normal limits  FIBRIN DERIVATIVES D-DIMER ([facility] ONLY)  TROPONIN I (HIGH SENSITIVITY)  TROPONIN I (HIGH SENSITIVITY)   ____________________________________________  EKG   ____________________________________________  RADIOLOGY Geraldo Pitter, personally viewed and evaluated these images (plain radiographs) as part of my medical decision making, as well as reviewing the written report by the radiologist.  DG Chest 1 View  Result Date: 03/11/2020 CLINICAL DATA:  Shortness of breath. Chest pain. Cough. Chest tightness. COVID positive 6 days ago. EXAM: CHEST  1 VIEW COMPARISON:  September 11, 2015. FINDINGS: The heart size and mediastinal contours are within normal limits. Both lungs are clear. No visible pleural effusions or pneumothorax. No acute osseous abnormality. IMPRESSION: No active disease. Electronically Signed   By: Feliberto Harts MD   On: 03/11/2020 17:01    ____________________________________________    PROCEDURES  Procedure(s) performed:      Procedures     Medications - No data to display   ____________________________________________   INITIAL IMPRESSION / ASSESSMENT AND PLAN / ED COURSE  Pertinent labs & imaging results that were available during my care of the patient were reviewed by me and considered in my medical decision making (see chart for details).      Assessment and Plan: Shortness of breath:  35 year old male presents to the emergency department with shortness of breath that started today after being symptomatic with COVID-19 for 7 days.  Vital signs were reassuring at triage.  On physical exam, patient was alert, active and nontoxic-appearing.  He was mildly tachypneic but had no adventitious lung sounds auscultated and did have lung sounds in the bases bilaterally.  D-dimer was within reference range.  There was no established consolidations, opacities or infiltrates on chest x-ray.  Patient did have leukocytosis on CBC.  I am concerned for early community-acquired pneumonia.  Will treat with azithromycin and amoxicillin at home with an albuterol inhaler prescribed at discharge.  Return precautions were given to return with new or worsening symptoms.  All patient questions were answered.    ____________________________________________  FINAL CLINICAL IMPRESSION(S) / ED DIAGNOSES  Final diagnoses:  Shortness of breath      NEW MEDICATIONS STARTED DURING THIS VISIT:  ED Discharge Orders         Ordered    azithromycin (ZITHROMAX Z-PAK) 250 MG tablet        03/11/20 1905    amoxicillin (AMOXIL) 875 MG tablet  2 times daily        03/11/20 1905    albuterol (VENTOLIN HFA) 108 (90 Base) MCG/ACT inhaler  Every 6 hours PRN        03/11/20 1906              This chart was dictated using voice recognition software/Dragon. Despite best efforts to proofread, errors can occur which can change the meaning. Any change was purely unintentional.     Orvil Feil, PA-C 03/11/20  1914    Delton Prairie, MD 03/11/20 [phone]

## 2020-03-11 NOTE — ED Notes (Signed)
[facility] is where he took his COVID test, on 03/06/2020

## 2022-05-09 ENCOUNTER — Emergency Department: Payer: Self-pay

## 2022-05-09 ENCOUNTER — Encounter: Payer: Self-pay | Admitting: Emergency Medicine

## 2022-05-09 ENCOUNTER — Other Ambulatory Visit: Payer: Self-pay

## 2022-05-09 ENCOUNTER — Emergency Department
Admission: EM | Admit: 2022-05-09 | Discharge: 2022-05-09 | Disposition: A | Discharge status: Home / Self Care | Payer: Self-pay | Attending: Emergency Medicine | Admitting: Emergency Medicine

## 2022-05-09 DIAGNOSIS — W310XXA Contact with mining and earth-drilling machinery, initial encounter: Secondary | ICD-10-CM | POA: Insufficient documentation

## 2022-05-09 DIAGNOSIS — S62354A Nondisplaced fracture of shaft of fourth metacarpal bone, right hand, initial encounter for closed fracture: Secondary | ICD-10-CM | POA: Insufficient documentation

## 2022-05-09 MED ORDER — HYDROCODONE-ACETAMINOPHEN 5-325 MG PO TABS: 1.0000 | ORAL_TABLET | Freq: Three times a day (TID) | ORAL | 0 refills | Status: AC | PRN

## 2022-05-09 NOTE — ED Provider Notes (Signed)
[facility] Emergency Department Provider Note     Event Date/Time   First MD Initiated Contact with Patient 05/09/22 1717     (approximate)   History   Hand Pain   HPI  Douglas Munoz is a 37 y.o. male presents to the ED for evaluation of right hand pain.  Patient was using a drill at home, when he apparently hurt his hand.  He presents to the ED with dorsal swelling and pain to the right ring finger.  Patient was using a power hand drill, when the drill caught on the material, torquing his hand sharply.  He continued to work and presents to the ED for evaluation now some 3+ hours after the incident.  He denies any difficulty with range of motion or any change in gross sensation.  Physical Exam   Triage Vital Signs: ED Triage Vitals  Enc Vitals Group     BP 05/09/22 1710 (!) 140/104     Pulse Rate 05/09/22 1710 66     Resp 05/09/22 1710 18     Temp 05/09/22 1710 98 F (36.7 C)     Temp Source 05/09/22 1710 Oral     SpO2 05/09/22 1710 100 %     Weight --      Height --      Head Circumference --      Peak Flow --      Pain Score 05/09/22 1709 10     Pain Loc --      Pain Edu? --      Excl. in [place]? --     Most recent vital signs: Vitals:   05/09/22 1710 05/09/22 1844  BP: (!) 140/104 (!) 135/98  Pulse: 66 67  Resp: 18 16  Temp: 98 F (36.7 C)   SpO2: 100% 98%    General Awake, no distress. NAD CV:  Good peripheral perfusion.  RESP:  Normal effort.  ABD:  No distention.  MSK:  Right hand without obvious deformity or dislocation.  Patient with subtle soft tissue swelling over the dorsal fourth MCP.  Normal composite fist on the hand. NEURO: Cranial nerves II to XII grossly intact.  Normal intrinsic and opposition testing noted.   ED Results / Procedures / Treatments   Labs (all labs ordered are listed, but only abnormal results are displayed) Labs Reviewed - No data to display   EKG   RADIOLOGY  I personally viewed and  evaluated these images as part of my medical decision making, as well as reviewing the written report by the radiologist.  ED Provider Interpretation: Acute oblique fracture to the fourth metacarpal of the right hand  DG Hand Complete Right  Result Date: 05/09/2022 CLINICAL DATA:  Status post trauma. EXAM: RIGHT HAND - COMPLETE 3+ VIEW COMPARISON:  None Available. FINDINGS: There is acute fracture deformity involving the proximal and mid portions of the fourth right metacarpal. There is no evidence of arthropathy or other focal bone abnormality. Mild dorsal soft tissue swelling is seen. IMPRESSION: Acute fracture of the fourth right metacarpal. Electronically Signed   By: Virgina Norfolk M.D.   On: 05/09/2022 17:47     PROCEDURES:  Critical Care performed: No  .Splint Application  Date/Time: 05/09/2022 5:28 PM  Performed by: Melvenia Needles, PA-C Authorized by: Melvenia Needles, PA-C   Consent:    Consent obtained:  Verbal   Consent given by:  Patient   Risks, benefits, and alternatives were discussed: yes  Risks discussed:  Pain Universal protocol:    Imaging studies available: yes     Site/side marked: yes     Patient identity confirmed:  Verbally with patient Pre-procedure details:    Distal neurologic exam:  Normal   Distal perfusion: distal pulses strong   Procedure details:    Location:  Hand   Hand location:  R hand   Splint type:  Ulnar gutter   Supplies:  Cotton padding, elastic bandage and fiberglass Post-procedure details:    Distal neurologic exam:  Normal   Distal perfusion: distal pulses strong     Procedure completion:  Tolerated well, no immediate complications   Post-procedure imaging: not applicable      MEDICATIONS ORDERED IN ED: Medications - No data to display   IMPRESSION / MDM / [place] / ED COURSE  I reviewed the triage vital signs and the nursing notes.                              Differential diagnosis  includes, but is not limited to, hand contusion, hand sprain, laceration, puncture wound  Patient's presentation is most consistent with acute complicated illness / injury requiring diagnostic workup.  Patient's diagnosis is consistent with right hand fourth metacarpal fracture.  Patient with x-ray evidence of an oblique fracture to the fourth metacarpal after mechanical injury.  Initial fracture care provided in the ED including splint application.  Patient will be discharged home with prescriptions for hydrocodone. Patient is to follow up with [facility] as needed or otherwise directed. Patient is given ED precautions to return to the ED for any worsening or new symptoms.  FINAL CLINICAL IMPRESSION(S) / ED DIAGNOSES   Final diagnoses:  Closed nondisplaced fracture of shaft of fourth metacarpal bone of right hand, initial encounter     Rx / DC Orders   ED Discharge Orders          Ordered    HYDROcodone-acetaminophen (NORCO) 5-325 MG tablet  3 times daily PRN        05/09/22 1834             Note:  This document was prepared using Dragon voice recognition software and may include unintentional dictation errors.    Melvenia Needles, PA-C 05/09/22 1910    Lavonia Drafts, MD 05/09/22 Kathyrn Drown

## 2022-05-09 NOTE — Discharge Instructions (Addendum)
You are being placed in an OCL hand splint for your hand fracture.  Keep the splint clean and dry and in place until you are evaluated by orthopedics.  Take OTC Tylenol or Motrin as needed for pain and inflammation.

## 2022-05-09 NOTE — ED Notes (Signed)
CMS intact, capillary refill <3 seconds. Pt educated on care of splint, pain management, follow-up care, and extremity elevation. Pt verbalizes understanding. Verbal consent obtained from discharge from pt.

## 2022-05-09 NOTE — ED Triage Notes (Signed)
Patient to ED via POV with right hand pain. Patient was working with a drill when he hurt hand. C/o pain on top with swelling noted and to ring finger.

## 2022-05-09 NOTE — ED Notes (Signed)
ED tech called to splint pt.

## 2022-07-03 IMAGING — DX DG CHEST 1V
1 series · 1 of 1 positions shown · non-contrast
Comparison: September 11, 2015.

CLINICAL DATA: Shortness of breath. Chest pain. Cough. Chest
tightness. COVID positive 6 days ago.

EXAM:
CHEST  1 VIEW

[chest ap]
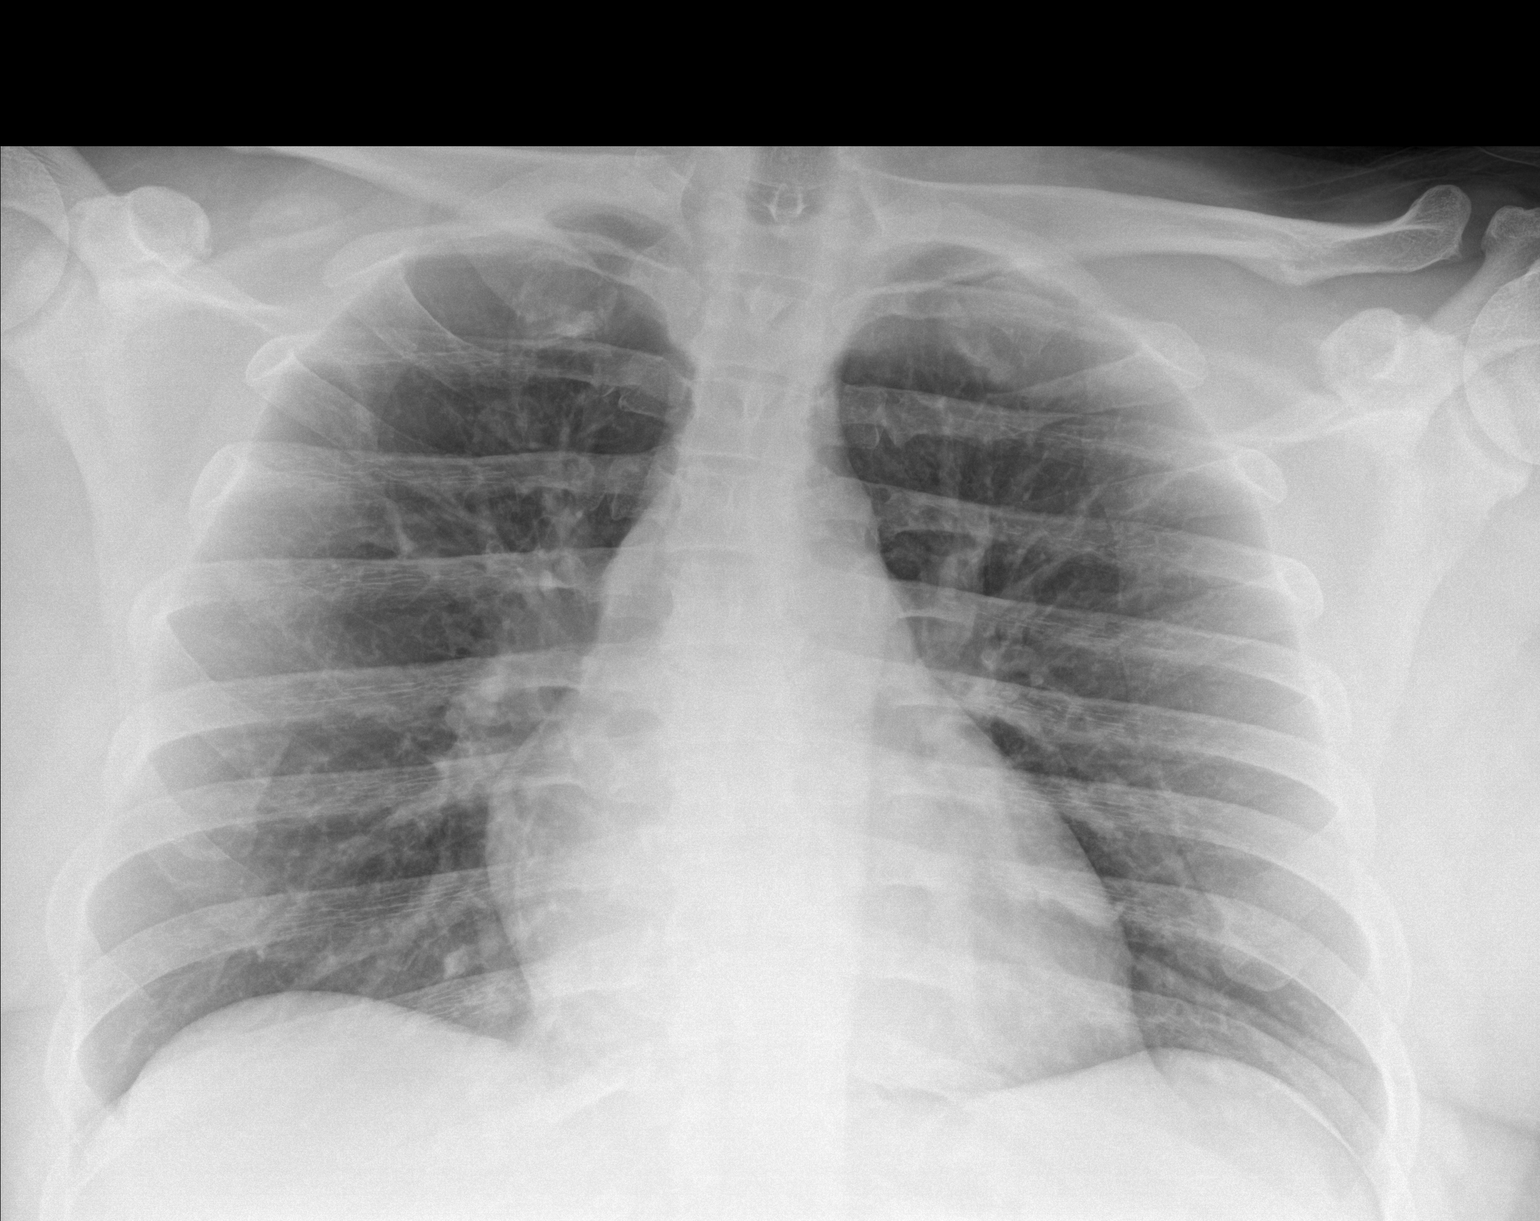

[1 of 1 positions shown; findings below may reference images not displayed]

FINDINGS: The heart size and mediastinal contours are within normal limits.
Both lungs are clear. No visible pleural effusions or pneumothorax.
No acute osseous abnormality.
IMPRESSION: No active disease.

## 2023-03-16 ENCOUNTER — Other Ambulatory Visit: Payer: Self-pay

## 2023-03-16 ENCOUNTER — Emergency Department: Payer: BC Managed Care – PPO

## 2023-03-16 ENCOUNTER — Emergency Department
Admission: EM | Admit: 2023-03-16 | Discharge: 2023-03-17 | Disposition: A | Discharge status: Home / Self Care | Payer: BC Managed Care – PPO | Attending: Emergency Medicine | Admitting: Emergency Medicine

## 2023-03-16 DIAGNOSIS — R0602 Shortness of breath: Secondary | ICD-10-CM | POA: Diagnosis not present

## 2023-03-16 DIAGNOSIS — F17200 Nicotine dependence, unspecified, uncomplicated: Secondary | ICD-10-CM | POA: Insufficient documentation

## 2023-03-16 DIAGNOSIS — R079 Chest pain, unspecified: Secondary | ICD-10-CM

## 2023-03-16 DIAGNOSIS — R0789 Other chest pain: Secondary | ICD-10-CM | POA: Insufficient documentation

## 2023-03-16 LAB — CBC
HCT: 41.6 % (ref 39.0–52.0)
Hemoglobin: 14.3 g/dL (ref 13.0–17.0)
MCH: 27.7 pg (ref 26.0–34.0)
MCHC: 34.4 g/dL (ref 30.0–36.0)
MCV: 80.5 fL (ref 80.0–100.0)
Platelets: 169 10*3/uL (ref 150–400)
RBC: 5.17 MIL/uL (ref 4.22–5.81)
RDW: 14.4 % (ref 11.5–15.5)
WBC: 9.9 10*3/uL (ref 4.0–10.5)
nRBC: 0 % (ref 0.0–0.2)

## 2023-03-16 LAB — BASIC METABOLIC PANEL
Anion gap: 13 (ref 5–15)
BUN: 16 mg/dL (ref 6–20)
CO2: 24 mmol/L (ref 22–32)
Calcium: 9.2 mg/dL (ref 8.9–10.3)
Chloride: 100 mmol/L (ref 98–111)
Creatinine, Ser: 1.22 mg/dL (ref 0.61–1.24)
GFR, Estimated: >60 mL/min (ref >60)
Glucose, Bld: 86 mg/dL (ref 70–99)
Potassium: 3.5 mmol/L (ref 3.5–5.1)
Sodium: 137 mmol/L (ref 135–145)

## 2023-03-16 LAB — TROPONIN I (HIGH SENSITIVITY): Troponin I (High Sensitivity): 3 ng/L (ref <18)

## 2023-03-16 NOTE — ED Triage Notes (Signed)
Pt reports chest discomfort and left arm tingling sensation. Pt reports some sob as well, denies cough congestion fever. Denies cardiac hx. Pts speech clear no vision changes or other extremity weakness at this time.

## 2023-03-17 LAB — HEPATIC FUNCTION PANEL
ALT: 28 U/L (ref 0–44)
AST: 25 U/L (ref 15–41)
Albumin: 4.3 g/dL (ref 3.5–5.0)
Alkaline Phosphatase: 52 U/L (ref 38–126)
Bilirubin, Direct: <0.1 mg/dL (ref 0.0–0.2)
Total Bilirubin: 0.6 mg/dL (ref 0.0–1.2)
Total Protein: 7.7 g/dL (ref 6.5–8.1)

## 2023-03-17 LAB — TROPONIN I (HIGH SENSITIVITY): Troponin I (High Sensitivity): <2 ng/L (ref <18)

## 2023-03-17 LAB — LIPASE, BLOOD: Lipase: 30 U/L (ref 11–51)

## 2023-03-17 MED ORDER — NAPROXEN 500 MG PO TABS: 500.0000 mg | ORAL_TABLET | Freq: Two times a day (BID) | ORAL | 0 refills | Status: DC

## 2023-03-17 MED ORDER — HYDROCODONE-ACETAMINOPHEN 5-325 MG PO TABS: 1.0000 | ORAL_TABLET | Freq: Four times a day (QID) | ORAL | 0 refills | Status: DC | PRN

## 2023-03-17 MED ORDER — KETOROLAC TROMETHAMINE 60 MG/2ML IM SOLN
30.0000 mg | Freq: Once | INTRAMUSCULAR | Status: AC
  Administered 2023-03-17: 30 mg via INTRAMUSCULAR
  Filled 2023-03-17: qty 2

## 2023-03-17 NOTE — ED Provider Notes (Signed)
[facility] Provider Note    Event Date/Time   First MD Initiated Contact with Patient 03/16/23 2338     (approximate)   History   Chest Pain   HPI  Douglas Munoz is a 38 y.o. male  who presents to the ED from home with a chief complaint of chest discomfort since yesterday. Waxing/waning "funny feeling" in chest worse at work Lex[employer], exertional work). Denies associated diaphoresis, palpitations, nausea, vomiting or dizziness. Endorses slight shortness of breath. Denies fever/chills, abdominal pain, diarrhea. Denies recent travel, trauma or hormone use. Denies illicit drugs. +Smoker.       Past Medical History  History reviewed. No pertinent past medical history.   Active Problem List  There are no active problems to display for this patient.    Past Surgical History   Past Surgical History:  Procedure Laterality Date   NO PAST SURGERIES       Home Medications   Prior to Admission medications   Medication Sig Start Date End Date Taking? Authorizing Provider  albuterol (VENTOLIN HFA) 108 (90 Base) MCG/ACT inhaler Inhale 2 puffs into the lungs every 6 (six) hours as needed for wheezing or shortness of breath. 03/11/20   Orvil Feil, PA-C     Allergies  Patient has no known allergies.   Family History  History reviewed. No pertinent family history.   Physical Exam  Triage Vital Signs: ED Triage Vitals  Encounter Vitals Group     BP 03/16/23 2007 (!) 138/90     Systolic BP Percentile --      Diastolic BP Percentile --      Pulse Rate 03/16/23 2007 75     Resp 03/16/23 2007 18     Temp 03/16/23 2015 97.8 F (36.6 C)     Temp Source 03/16/23 2015 Oral     SpO2 03/16/23 2007 94 %     Weight 03/16/23 2007 290 lb (131.5 kg)     Height 03/16/23 2007 6\' 2"  (1.88 m)     Head Circumference --      Peak Flow --      Pain Score 03/16/23 2007 8     Pain Loc --      Pain Education --      Exclude from Growth Chart --      Updated Vital Signs: BP (!) 138/90   Pulse 75   Temp 97.8 F (36.6 C) (Oral)   Resp 18   Ht 6\' 2"  (1.88 m)   Wt 131.5 kg   SpO2 94%   BMI 37.23 kg/m    General: Awake, no distress.  CV:  RRR. Good peripheral perfusion.  Resp:  Normal effort. CTAB. Left anterior chest wall tender to palpation. Abd:  Nontender. No distention.  Other:  Bilateral calves are supple and nontender.   ED Results / Procedures / Treatments  Labs (all labs ordered are listed, but only abnormal results are displayed) Labs Reviewed  BASIC METABOLIC PANEL  CBC  HEPATIC FUNCTION PANEL  LIPASE, BLOOD  TROPONIN I (HIGH SENSITIVITY)  TROPONIN I (HIGH SENSITIVITY)     EKG  ED ECG REPORT I, Brave Dack J, the attending physician, personally viewed and interpreted this ECG.   Date: 03/17/2023  EKG Time: 2010  Rate: 70  Rhythm: normal sinus rhythm  Axis: Normal  Intervals:none  ST&T Change: Nonspecific    RADIOLOGY I have independently visualized and interpreted patient's imaging study as well as noted the radiology interpretation:  CXR:  No acute cardiopulmonary process  Official radiology report(s): DG Chest 2 View Result Date: 03/16/2023 CLINICAL DATA:  Chest pain EXAM: CHEST - 2 VIEW COMPARISON:  03/11/2020 FINDINGS: The heart size and mediastinal contours are within normal limits. Both lungs are clear. The visualized skeletal structures are unremarkable. IMPRESSION: No active cardiopulmonary disease. Electronically Signed   By: Minerva Fester M.D.   On: 03/16/2023 20:35     PROCEDURES:  Critical Care performed: No  .1-3 Lead EKG Interpretation  Performed by: Irean Hong, MD Authorized by: Irean Hong, MD     Interpretation: normal     ECG rate:  75   ECG rate assessment: normal     Rhythm: sinus rhythm     Ectopy: none     Conduction: normal   Comments:     Patient placed on cardiac monitor to evaluate for arrthymias    MEDICATIONS ORDERED IN ED: Medications   ketorolac (TORADOL) injection 30 mg (30 mg Intramuscular Given 03/17/23 0033)     IMPRESSION / MDM / ASSESSMENT AND PLAN / ED COURSE  I reviewed the triage vital signs and the nursing notes.                             38 year old male presenting with chest pain. Differential diagnosis includes, but is not limited to, ACS, aortic dissection, pulmonary embolism, cardiac tamponade, pneumothorax, pneumonia, pericarditis, myocarditis, GI-related causes including esophagitis/gastritis, and musculoskeletal chest wall pain.   I have personally reviewed patient's records and note PCP office visit for viral exposure 12/24/2018.  Patient's presentation is most consistent with acute complicated illness / injury requiring diagnostic workup.  The patient is on the cardiac monitor to evaluate for evidence of arrhythmia and/or significant heart rate changes.  Laboratory results demonstrate normal WBC 9.9, unremarkable electrolytes including LFTs and lipase, initial troponin negative. CXR is clear. Will repeat troponin, administer IM Ketorolac and reassess.  Clinical Course as of 03/17/23 0144  Tue Mar 17, 2023  0143 Feeling better. Repeat troponin remains negative. Will discharge home with as needed prescriptions for NSAIDs, analgesia and patient will follow up with cardiology closely. Strict return precautions given. Patient verbalizes understanding and agrees with plan of care. [JS]    Clinical Course User Index [JS] Irean Hong, MD     FINAL CLINICAL IMPRESSION(S) / ED DIAGNOSES   Final diagnoses:  Chest pain, unspecified type     Rx / DC Orders   ED Discharge Orders     None        Note:  This document was prepared using Dragon voice recognition software and may include unintentional dictation errors.   Irean Hong, MD 03/17/23 [phone]

## 2023-03-17 NOTE — Discharge Instructions (Signed)
You may take pain medicines as needed. Apply moist heat to affected area several times daily. Return to the ER for worsening symptoms, persistent vomiting, difficulty breathing or other concerns.

## 2023-03-20 ENCOUNTER — Ambulatory Visit
Admission: EM | Admit: 2023-03-20 | Discharge: 2023-03-20 | Disposition: A | Discharge status: Home / Self Care | Payer: BC Managed Care – PPO | Attending: Family Medicine | Admitting: Family Medicine

## 2023-03-20 ENCOUNTER — Encounter: Payer: Self-pay | Admitting: Emergency Medicine

## 2023-03-20 DIAGNOSIS — Z202 Contact with and (suspected) exposure to infections with a predominantly sexual mode of transmission: Secondary | ICD-10-CM | POA: Diagnosis present

## 2023-03-20 DIAGNOSIS — R1033 Periumbilical pain: Secondary | ICD-10-CM | POA: Insufficient documentation

## 2023-03-20 LAB — CBC WITH DIFFERENTIAL/PLATELET
Abs Immature Granulocytes: 0.02 10*3/uL (ref 0.00–0.07)
Basophils Absolute: 0.1 10*3/uL (ref 0.0–0.1)
Basophils Relative: 1 %
Eosinophils Absolute: 0.1 10*3/uL (ref 0.0–0.5)
Eosinophils Relative: 1 %
HCT: 43.7 % (ref 39.0–52.0)
Hemoglobin: 14.8 g/dL (ref 13.0–17.0)
Immature Granulocytes: 0 %
Lymphocytes Relative: 34 %
Lymphs Abs: 2.8 10*3/uL (ref 0.7–4.0)
MCH: 26.7 pg (ref 26.0–34.0)
MCHC: 33.9 g/dL (ref 30.0–36.0)
MCV: 78.9 fL — ABNORMAL LOW (ref 80.0–100.0)
Monocytes Absolute: 0.5 10*3/uL (ref 0.1–1.0)
Monocytes Relative: 7 %
Neutro Abs: 4.6 10*3/uL (ref 1.7–7.7)
Neutrophils Relative %: 57 %
Platelets: 189 10*3/uL (ref 150–400)
RBC: 5.54 MIL/uL (ref 4.22–5.81)
RDW: 14.6 % (ref 11.5–15.5)
WBC: 8.1 10*3/uL (ref 4.0–10.5)
nRBC: 0 % (ref 0.0–0.2)

## 2023-03-20 LAB — URINALYSIS, W/ REFLEX TO CULTURE (INFECTION SUSPECTED)
Bacteria, UA: NONE SEEN
Bilirubin Urine: NEGATIVE
Glucose, UA: NEGATIVE mg/dL
Hgb urine dipstick: NEGATIVE
Ketones, ur: NEGATIVE mg/dL
Leukocytes,Ua: NEGATIVE
Nitrite: NEGATIVE
Protein, ur: NEGATIVE mg/dL
RBC / HPF: NONE SEEN RBC/hpf (ref 0–5)
Specific Gravity, Urine: 1.025 (ref 1.005–1.030)
Squamous Epithelial / HPF: NONE SEEN /[HPF] (ref 0–5)
pH: 5.5 (ref 5.0–8.0)

## 2023-03-20 LAB — COMPREHENSIVE METABOLIC PANEL
ALT: 29 U/L (ref 0–44)
AST: 24 U/L (ref 15–41)
Albumin: 4.9 g/dL (ref 3.5–5.0)
Alkaline Phosphatase: 56 U/L (ref 38–126)
Anion gap: 9 (ref 5–15)
BUN: 17 mg/dL (ref 6–20)
CO2: 25 mmol/L (ref 22–32)
Calcium: 9.8 mg/dL (ref 8.9–10.3)
Chloride: 100 mmol/L (ref 98–111)
Creatinine, Ser: 1.15 mg/dL (ref 0.61–1.24)
GFR, Estimated: >60 mL/min (ref >60)
Glucose, Bld: 100 mg/dL — ABNORMAL HIGH (ref 70–99)
Potassium: 4.3 mmol/L (ref 3.5–5.1)
Sodium: 134 mmol/L — ABNORMAL LOW (ref 135–145)
Total Bilirubin: 0.8 mg/dL (ref 0.0–1.2)
Total Protein: 8.9 g/dL — ABNORMAL HIGH (ref 6.5–8.1)

## 2023-03-20 LAB — RESP PANEL BY RT-PCR (FLU A&B, COVID) ARPGX2
Influenza A by PCR: NEGATIVE
Influenza B by PCR: NEGATIVE
SARS Coronavirus 2 by RT PCR: NEGATIVE

## 2023-03-20 LAB — GROUP A STREP BY PCR: Group A Strep by PCR: NOT DETECTED

## 2023-03-20 MED ORDER — CEFTRIAXONE SODIUM 500 MG IJ SOLR
500.0000 mg | Freq: Once | INTRAMUSCULAR | Status: AC
  Administered 2023-03-20: 500 mg via INTRAMUSCULAR

## 2023-03-20 MED ORDER — CEFTRIAXONE SODIUM 500 MG IJ SOLR: 500.0000 mg | Freq: Once | INTRAMUSCULAR | Status: DC

## 2023-03-20 NOTE — ED Triage Notes (Signed)
Patient c/o stomach pain and low grade fever that started 2 days ago.  Patient denies any other cold symptoms.  Patient denies N/V/D.

## 2023-03-20 NOTE — ED Provider Notes (Signed)
[facility]    CSN: [id number] Arrival date & time: 03/20/23  1239      History   Chief Complaint Chief Complaint  Patient presents with   Abdominal Pain    HPI Douglas Munoz is a 38 y.o. male.   HPI  History obtained from the patient. Douglas Munoz presents for periumbilical abdominal pain that started 2 days ago.  No nausea, vomiting, diarrhea, cough, rhinorrhea, headache. Endorses nasal congestion and scratchy throat. No known sick contacts.  Has been having fevers.  Took Tylenol and ibuprofen yesterday.  No dysuria, urinary frequency, urinary urgency.  No hematuria. He still has his appendix.  States that he knows something is different in his stomach.    History reviewed. No pertinent past medical history.  There are no active problems to display for this patient.   Past Surgical History:  Procedure Laterality Date   NO PAST SURGERIES         Home Medications    Prior to Admission medications   Medication Sig Start Date End Date Taking? Authorizing Provider  albuterol (VENTOLIN HFA) 108 (90 Base) MCG/ACT inhaler Inhale 2 puffs into the lungs every 6 (six) hours as needed for wheezing or shortness of breath. 03/11/20   Orvil Feil, PA-C    Family History History reviewed. No pertinent family history.  Social History Social History   Tobacco Use   Smoking status: Every Day    Current packs/day: 0.50    Types: Cigarettes   Smokeless tobacco: Never  Vaping Use   Vaping status: Never Used  Substance Use Topics   Alcohol use: No    Alcohol/week: 0.0 standard drinks of alcohol   Drug use: No     Allergies   Patient has no known allergies.   Review of Systems Review of Systems: negative unless otherwise stated in HPI.      Physical Exam Triage Vital Signs ED Triage Vitals  Encounter Vitals Group     BP 03/20/23 1256 (!) 144/89     Systolic BP Percentile --      Diastolic BP Percentile --      Pulse Rate 03/20/23 1256 66      Resp 03/20/23 1256 15     Temp 03/20/23 1256 98.5 F (36.9 C)     Temp Source 03/20/23 1256 Oral     SpO2 03/20/23 1256 98 %     Weight 03/20/23 1254 290 lb (131.5 kg)     Height 03/20/23 1254 6\' 2"  (1.88 m)     Head Circumference --      Peak Flow --      Pain Score 03/20/23 1254 5     Pain Loc --      Pain Education --      Exclude from Growth Chart --    No data found.  Updated Vital Signs BP (!) 144/89 (BP Location: Right Arm)   Pulse 66   Temp 98.5 F (36.9 C) (Oral)   Resp 15   Ht 6\' 2"  (1.88 m)   Wt 131.5 kg   SpO2 98%   BMI 37.23 kg/m   Visual Acuity Right Eye Distance:   Left Eye Distance:   Bilateral Distance:    Right Eye Near:   Left Eye Near:    Bilateral Near:     Physical Exam GEN:     alert, non-toxic appearing male in no distress    HENT:  mucus membranes moist, oropharyngeal without lesions, mild erythema, no tonsillar  hypertrophy or exudates, no nasal discharge EYES:   pupils equal and reactive, no scleral injection or icterus NECK:  normal ROM, no lymphadenopathy, no meningismus   RESP:  no increased work of breathing, clear to auscultation bilaterally CVS:   regular rate and rhythm ABD:   Soft, lower abdominal tenderness, nondistended, no guarding, no rebound, active bowel sounds throughout, negative McBurney's, negative Murphy's Skin:   warm and dry, no rash on visible skin    UC Treatments / Results  Labs (all labs ordered are listed, but only abnormal results are displayed) Labs Reviewed  COMPREHENSIVE METABOLIC PANEL - Abnormal; Notable for the following components:      Result Value   Sodium 134 (*)    Glucose, Bld 100 (*)    Total Protein 8.9 (*)    All other components within normal limits  CBC WITH DIFFERENTIAL/PLATELET - Abnormal; Notable for the following components:   MCV 78.9 (*)    All other components within normal limits  GROUP A STREP BY PCR  RESP PANEL BY RT-PCR (FLU A&B, COVID) ARPGX2  URINALYSIS, W/ REFLEX TO  CULTURE (INFECTION SUSPECTED)  CYTOLOGY, (ORAL, ANAL, URETHRAL) ANCILLARY ONLY    EKG   Radiology No results found.  Procedures Procedures (including critical care time)  Medications Ordered in UC Medications  cefTRIAXone (ROCEPHIN) injection 500 mg (500 mg Intramuscular Given 03/20/23 1540)    Initial Impression / Assessment and Plan / UC Course  I have reviewed the triage vital signs and the nursing notes.  Pertinent labs & imaging results that were available during my care of the patient were reviewed by me and considered in my medical decision making (see chart for details).       Pt is a 38 y.o. male who presents for 2 days of abdominal pain and respiratory symptoms. Douglas Munoz is afebrile here without recent antipyretics. Satting well on room air. Overall pt is non-toxic appearing, well hydrated, without respiratory distress.  Cardiopulmonary and abdominal exams unremarkable.  COVID and influenza panel was negative.  Strep testing obtained and was negative.  Discussed blood work and urinalysis and patient is agreeable.    Urinalysis is normal.  Doubt acute cystitis or kidney stone at this time.  CBC showing mild microcytosis otherwise normal.  No leukocytosis to suggest cholecystitis, appendicitis or pancreatitis.  He has mild hyponatremia, sodium 134.  He does have some proteinemia, total protein 8.9.  Liver and kidney function tests are normal.  Discussed ED evaluation for abdominal imaging as he is concerned about his abdominal discomfort.  On further discussion, he reports similar symptoms when he had gonorrhea in the past.  He does not want to wait for cytology results to come back to be treated.  Gonorrhea, trichomonas and chlamydia testing obtained before urinalysis. He requested preemptive treatment for gonorrhea.    Return and ED precautions given and voiced understanding. Discussed MDM, treatment plan and plan for follow-up with patient who agrees with plan.     Final  Clinical Impressions(s) / UC Diagnoses   Final diagnoses:  Periumbilical abdominal pain  Possible exposure to STD     Discharge Instructions      Your COVID, influenza and strep test were all negative.  Your urine did not show evidence of a urinary tract infection and you did not have any blood in your urine to suggest a kidney stone.  Your blood levels and electrolytes and kidney and liver function did not show anything that needs to be treated.  You are treated prophylactically for gonorrhea as he reported similar symptoms in the past.  Your STD test results will be available in the next 72 hours. If positive, someone will contact you.  You should see your results in your MyChart account.       ED Prescriptions   None    PDMP not reviewed this encounter.   Katha Cabal, DO 03/20/23 1646

## 2023-03-20 NOTE — Discharge Instructions (Addendum)
Your COVID, influenza and strep test were all negative.  Your urine did not show evidence of a urinary tract infection and you did not have any blood in your urine to suggest a kidney stone.  Your blood levels and electrolytes and kidney and liver function did not show anything that needs to be treated.   You are treated prophylactically for gonorrhea as he reported similar symptoms in the past.  Your STD test results will be available in the next 72 hours. If positive, someone will contact you.  You should see your results in your MyChart account.

## 2023-03-24 LAB — CYTOLOGY, (ORAL, ANAL, URETHRAL) ANCILLARY ONLY
Chlamydia: NEGATIVE
Comment: NEGATIVE
Comment: NEGATIVE
Comment: NORMAL
Neisseria Gonorrhea: NEGATIVE
Trichomonas: NEGATIVE

## 2023-04-02 ENCOUNTER — Ambulatory Visit
Admission: EM | Admit: 2023-04-02 | Discharge: 2023-04-02 | Disposition: A | Discharge status: Home / Self Care | Payer: BC Managed Care – PPO | Attending: Physician Assistant | Admitting: Physician Assistant

## 2023-04-02 DIAGNOSIS — R051 Acute cough: Secondary | ICD-10-CM | POA: Diagnosis present

## 2023-04-02 DIAGNOSIS — J101 Influenza due to other identified influenza virus with other respiratory manifestations: Secondary | ICD-10-CM

## 2023-04-02 LAB — RESP PANEL BY RT-PCR (FLU A&B, COVID) ARPGX2
Influenza A by PCR: POSITIVE — AB
Influenza B by PCR: NEGATIVE
SARS Coronavirus 2 by RT PCR: NEGATIVE

## 2023-04-02 MED ORDER — OSELTAMIVIR PHOSPHATE 75 MG PO CAPS: 75.0000 mg | ORAL_CAPSULE | Freq: Two times a day (BID) | ORAL | 0 refills | Status: AC

## 2023-04-02 MED ORDER — PROMETHAZINE-DM 6.25-15 MG/5ML PO SYRP: 5.0000 mL | ORAL_SOLUTION | Freq: Four times a day (QID) | ORAL | 0 refills | Status: AC | PRN

## 2023-04-02 NOTE — ED Provider Notes (Signed)
 [facility]    CSN: [id number] Arrival date & time: 04/02/23  1037      History   Chief Complaint No chief complaint on file.   HPI Douglas Munoz is a 38 y.o. male presenting for fever, fatigue, cough, sore throat, congestion/runny nose, and bodyaches for the past day.  Denies chest pain, wheezing, shortness of breath, abdominal pain, nausea/vomiting or diarrhea.  His daughter has flu. Has been taking Delsym.  No other complaints.  HPI  History reviewed. No pertinent past medical history.  There are no active problems to display for this patient.   Past Surgical History:  Procedure Laterality Date   NO PAST SURGERIES         Home Medications    Prior to Admission medications   Medication Sig Start Date End Date Taking? Authorizing Provider  albuterol  (VENTOLIN  HFA) 108 (90 Base) MCG/ACT inhaler Inhale 2 puffs into the lungs every 6 (six) hours as needed for wheezing or shortness of breath. 03/11/20  Yes Woods, Jaclyn M, PA-C  oseltamivir  (TAMIFLU ) 75 MG capsule Take 1 capsule (75 mg total) by mouth every 12 (twelve) hours for 5 days. 04/02/23 04/07/23 Yes Arvis Huxley B, PA-C  promethazine -dextromethorphan (PROMETHAZINE -DM) 6.25-15 MG/5ML syrup Take 5 mLs by mouth 4 (four) times daily as needed. 04/02/23  Yes Arvis Huxley NOVAK, PA-C    Family History History reviewed. No pertinent family history.  Social History Social History   Tobacco Use   Smoking status: Every Day    Current packs/day: 0.50    Types: Cigarettes   Smokeless tobacco: Never  Vaping Use   Vaping status: Never Used  Substance Use Topics   Alcohol use: No    Alcohol/week: 0.0 standard drinks of alcohol   Drug use: No     Allergies   Patient has no known allergies.   Review of Systems Review of Systems  Constitutional:  Positive for fatigue and fever.  HENT:  Positive for congestion, rhinorrhea and sore throat. Negative for sinus pressure and sinus pain.   Respiratory:   Positive for cough. Negative for shortness of breath.   Cardiovascular:  Negative for chest pain.  Gastrointestinal:  Negative for abdominal pain, diarrhea, nausea and vomiting.  Musculoskeletal:  Positive for myalgias.  Neurological:  Positive for headaches. Negative for weakness and light-headedness.  Hematological:  Negative for adenopathy.     Physical Exam Triage Vital Signs ED Triage Vitals  Encounter Vitals Group     BP 04/02/23 1050 (!) 148/86     Systolic BP Percentile --      Diastolic BP Percentile --      Pulse Rate 04/02/23 1050 77     Resp 04/02/23 1050 20     Temp 04/02/23 1050 99.3 F (37.4 C)     Temp Source 04/02/23 1050 Oral     SpO2 04/02/23 1050 98 %     Weight 04/02/23 1049 285 lb (129.3 kg)     Height 04/02/23 1049 6' 2 (1.88 m)     Head Circumference --      Peak Flow --      Pain Score 04/02/23 1049 5     Pain Loc --      Pain Education --      Exclude from Growth Chart --    No data found.  Updated Vital Signs BP (!) 148/86 (BP Location: Left Arm)   Pulse 77   Temp 99.3 F (37.4 C) (Oral)   Resp 20  Ht 6' 2 (1.88 m)   Wt 285 lb (129.3 kg)   SpO2 98%   BMI 36.59 kg/m      Physical Exam Vitals and nursing note reviewed.  Constitutional:      General: He is not in acute distress.    Appearance: Normal appearance. He is well-developed. He is not ill-appearing.  HENT:     Head: Normocephalic and atraumatic.     Nose: Congestion present.     Mouth/Throat:     Mouth: Mucous membranes are moist.     Pharynx: Oropharynx is clear.  Eyes:     General: No scleral icterus.    Conjunctiva/sclera: Conjunctivae normal.  Cardiovascular:     Rate and Rhythm: Normal rate and regular rhythm.     Heart sounds: Normal heart sounds.  Pulmonary:     Effort: Pulmonary effort is normal. No respiratory distress.     Breath sounds: Normal breath sounds.  Musculoskeletal:     Cervical back: Neck supple.  Skin:    General: Skin is warm and dry.      Capillary Refill: Capillary refill takes less than 2 seconds.  Neurological:     General: No focal deficit present.     Mental Status: He is alert. Mental status is at baseline.     Motor: No weakness.     Gait: Gait normal.  Psychiatric:        Mood and Affect: Mood normal.        Behavior: Behavior normal.      UC Treatments / Results  Labs (all labs ordered are listed, but only abnormal results are displayed) Labs Reviewed  RESP PANEL BY RT-PCR (FLU A&B, COVID) ARPGX2 - Abnormal; Notable for the following components:      Result Value   Influenza A by PCR POSITIVE (*)    All other components within normal limits    EKG   Radiology No results found.  Procedures Procedures (including critical care time)  Medications Ordered in UC Medications - No data to display  Initial Impression / Assessment and Plan / UC Course  I have reviewed the triage vital signs and the nursing notes.  Pertinent labs & imaging results that were available during my care of the patient were reviewed by me and considered in my medical decision making (see chart for details).   38 year old male presents for fever, fatigue, cough, congestion, sore throat, headaches and bodyaches for the past day.  He reports his virus flu.  Positive influenza A.  Reviewed result with patient.  Patient interested in Tamiflu .  Sent Tamiflu  to pharmacy as well as Promethazine  DM.  Reviewed current CDC guidelines, isolation protocol and ED precautions for flu.  Work note was given.   Final Clinical Impressions(s) / UC Diagnoses   Final diagnoses:  Influenza A  Acute cough     Discharge Instructions      - Flu is positive.  You are within the window for treatment Tamiflu  to potentially be helpful so I sent it to the pharmacy if you are positive. - Sent cough medicine and Tamiflu . - You need to isolate until you are fever free for 24 hours and symptoms are improving. - Increase rest and fluids. - You should  be seen again if you have uncontrolled fever, weakness or worsening breathing problem.    ED Prescriptions     Medication Sig Dispense Auth. Provider   oseltamivir  (TAMIFLU ) 75 MG capsule Take 1 capsule (75 mg total) by mouth  every 12 (twelve) hours for 5 days. 10 capsule Arvis Huxley B, PA-C   promethazine -dextromethorphan (PROMETHAZINE -DM) 6.25-15 MG/5ML syrup Take 5 mLs by mouth 4 (four) times daily as needed. 118 mL Arvis Huxley NOVAK, PA-C      PDMP not reviewed this encounter.   Arvis Huxley NOVAK, PA-C 04/02/23 1145

## 2023-04-02 NOTE — Discharge Instructions (Signed)
-   Flu is positive.  You are within the window for treatment Tamiflu to potentially be helpful so I sent it to the pharmacy if you are positive. - Sent cough medicine and Tamiflu. - You need to isolate until you are fever free for 24 hours and symptoms are improving. - Increase rest and fluids. - You should be seen again if you have uncontrolled fever, weakness or worsening breathing problem.

## 2023-04-02 NOTE — ED Triage Notes (Signed)
 Pt c/o fatigue, cough, headache x1day  Pt was wxposed to flu on 03/30/23

## 2023-04-08 ENCOUNTER — Ambulatory Visit
Admission: EM | Admit: 2023-04-08 | Discharge: 2023-04-08 | Disposition: A | Discharge status: Home / Self Care | Payer: BC Managed Care – PPO | Attending: Family Medicine | Admitting: Family Medicine

## 2023-04-08 DIAGNOSIS — R21 Rash and other nonspecific skin eruption: Secondary | ICD-10-CM | POA: Diagnosis present

## 2023-04-08 DIAGNOSIS — Z113 Encounter for screening for infections with a predominantly sexual mode of transmission: Secondary | ICD-10-CM | POA: Diagnosis present

## 2023-04-08 LAB — HIV ANTIBODY (ROUTINE TESTING W REFLEX): HIV Screen 4th Generation wRfx: NONREACTIVE

## 2023-04-08 MED ORDER — TRIAMCINOLONE ACETONIDE 0.5 % EX OINT: 1.0000 | TOPICAL_OINTMENT | Freq: Two times a day (BID) | CUTANEOUS | 0 refills | Status: AC

## 2023-04-08 NOTE — ED Provider Notes (Addendum)
[facility]    CSN: [id number] Arrival date & time: 04/08/23  1605      History   Chief Complaint Chief Complaint  Patient presents with   SEXUALLY TRANSMITTED DISEASE   Rash    HPI Douglas Munoz is a 38 y.o. male.   HPI  38 year old male presents for evaluation of rash in both of his armpits and on both of his thighs in the area of his stretch marks that he noticed today.  He describes the rash as being itchy.  The patient is requesting to be tested for STIs because of the rash.  He denies any penile discharge, testicular pain, rashes or lesions on his genitals.  He was seen on 04/02/2023 and diagnosed with influenza A and reports that he has a residual, slight, nonproductive cough.  History reviewed. No pertinent past medical history.  There are no active problems to display for this patient.   Past Surgical History:  Procedure Laterality Date   NO PAST SURGERIES         Home Medications    Prior to Admission medications   Medication Sig Start Date End Date Taking? Authorizing Provider  albuterol (VENTOLIN HFA) 108 (90 Base) MCG/ACT inhaler Inhale 2 puffs into the lungs every 6 (six) hours as needed for wheezing or shortness of breath. 03/11/20  Yes Pia Mau M, PA-C  promethazine-dextromethorphan (PROMETHAZINE-DM) 6.25-15 MG/5ML syrup Take 5 mLs by mouth 4 (four) times daily as needed. 04/02/23  Yes Eusebio Friendly B, PA-C  triamcinolone ointment (KENALOG) 0.5 % Apply 1 Application topically 2 (two) times daily. 04/08/23  Yes Becky Augusta, NP    Family History History reviewed. No pertinent family history.  Social History Social History   Tobacco Use   Smoking status: Every Day    Current packs/day: 0.50    Types: Cigarettes   Smokeless tobacco: Never  Vaping Use   Vaping status: Never Used  Substance Use Topics   Alcohol use: No    Alcohol/week: 0.0 standard drinks of alcohol   Drug use: No     Allergies   Patient has no known  allergies.   Review of Systems Review of Systems  Constitutional:  Positive for fever.  Respiratory:  Positive for cough.   Skin:  Positive for rash.     Physical Exam Triage Vital Signs ED Triage Vitals  Encounter Vitals Group     BP      Systolic BP Percentile      Diastolic BP Percentile      Pulse      Resp      Temp      Temp src      SpO2      Weight      Height      Head Circumference      Peak Flow      Pain Score      Pain Loc      Pain Education      Exclude from Growth Chart    No data found.  Updated Vital Signs BP (!) 158/101 (BP Location: Left Arm)   Pulse 70   Temp 98.8 F (37.1 C) (Oral)   Resp 16   Ht 6\' 2"  (1.88 m)   Wt 280 lb (127 kg)   SpO2 100%   BMI 35.95 kg/m   Visual Acuity Right Eye Distance:   Left Eye Distance:   Bilateral Distance:    Right Eye Near:   Left Eye Near:  Bilateral Near:     Physical Exam Vitals and nursing note reviewed.  Constitutional:      Appearance: Normal appearance. He is not ill-appearing.  Skin:    General: Skin is warm and dry.     Capillary Refill: Capillary refill takes less than 2 seconds.     Findings: Rash present. No erythema.  Neurological:     General: No focal deficit present.     Mental Status: He is alert and oriented to person, place, and time.      UC Treatments / Results  Labs (all labs ordered are listed, but only abnormal results are displayed) Labs Reviewed  HIV ANTIBODY (ROUTINE TESTING W REFLEX)  RPR  CYTOLOGY, (ORAL, ANAL, URETHRAL) ANCILLARY ONLY    EKG   Radiology No results found.  Procedures Procedures (including critical care time)  Medications Ordered in UC Medications - No data to display  Initial Impression / Assessment and Plan / UC Course  I have reviewed the triage vital signs and the nursing notes.  Pertinent labs & imaging results that were available during my care of the patient were reviewed by me and considered in my medical decision  making (see chart for details).   Patient is a pleasant, nontoxic-appearing, 38 year old male presenting for evaluation of skin rash in bilateral axilla and on the anteromedial aspect of both thighs.  The rash is in the area of his stretch marks.    As you can see in image above, patient has raised, maculopapular lesions on his skin but they are free of erythema.  They are smooth in texture.  The patient reports that similar lesions are on his thighs.  Etiology is unclear though it does not strike me as tinea cruris or any other form of topical fungal infection.  Most likely it is a viral exanthem as a result of his recent flu diagnosis.  I will treat him with a topical steroid triamcinolone 0.5% twice daily to see if it helps with the itching and inflammation.  If the rash does not resolve, or worsens, he had the need to return for reevaluation or follow-up with dermatology.  He is requesting STI treatment secondary to the rash so I will order a urethral cytology swab to evaluate for gonorrhea, chlamydia, HIV, RPR, and trichomonas.  Given that he is asymptomatic I would not treat him empirically but will rather wait for the results of the testing to see if any treatment is necessary.    Final Clinical Impressions(s) / UC Diagnoses   Final diagnoses:  Rash and nonspecific skin eruption  Routine screening for STI (sexually transmitted infection)     Discharge Instructions      Apply the triamcinolone 0.5% ointment to your skin rash twice daily to help with itching and inflammation.  I suspect most likely that your rash is a viral exanthem as a result of your recent influenza infection.  As your body's immune system comes down after fighting infection the rash should resolve.  We are also testing you for gonorrhea, chlamydia, and trichomonas today.  If you test positive for any result you will be contacted by phone and treatment options will be provided.  If your results are negative they will  appear in your MyChart.  If you develop any new or worsening symptoms either return for reevaluation or follow-up with your primary care provider.     ED Prescriptions     Medication Sig Dispense Auth. Provider   triamcinolone ointment (KENALOG) 0.5 %  Apply 1 Application topically 2 (two) times daily. 30 g Becky Augusta, NP      PDMP not reviewed this encounter.   Becky Augusta, NP 04/08/23 1645    Becky Augusta, NP 04/08/23 [phone]

## 2023-04-08 NOTE — ED Triage Notes (Signed)
Pt presents to UC for STD testing, c/o rash under bilateral arms & thighs since today. Denies any penile discharge.

## 2023-04-08 NOTE — Discharge Instructions (Addendum)
Apply the triamcinolone 0.5% ointment to your skin rash twice daily to help with itching and inflammation.  I suspect most likely that your rash is a viral exanthem as a result of your recent influenza infection.  As your body's immune system comes down after fighting infection the rash should resolve.  We are also testing you for gonorrhea, chlamydia, HIV, syphilis, and trichomonas today.  If you test positive for any result you will be contacted by phone and treatment options will be provided.  If your results are negative they will appear in your MyChart.  If you develop any new or worsening symptoms either return for reevaluation or follow-up with your primary care provider.

## 2023-04-09 LAB — CYTOLOGY, (ORAL, ANAL, URETHRAL) ANCILLARY ONLY
Chlamydia: NEGATIVE
Comment: NEGATIVE
Comment: NEGATIVE
Comment: NORMAL
Neisseria Gonorrhea: NEGATIVE
Trichomonas: NEGATIVE

## 2023-04-09 LAB — RPR: RPR Ser Ql: NONREACTIVE
